# Patient Record
Sex: Male | Born: 1955 | Marital: Married | Smoking: Never smoker
Health system: Southern US, Community
[De-identification: ages and names within clinical notes are randomized; demographics above are authoritative.]

## PROBLEM LIST (undated history)

## (undated) DIAGNOSIS — E785 Hyperlipidemia, unspecified: Secondary | ICD-10-CM

## (undated) DIAGNOSIS — K219 Gastro-esophageal reflux disease without esophagitis: Secondary | ICD-10-CM

## (undated) DIAGNOSIS — B0052 Herpesviral keratitis: Secondary | ICD-10-CM

## (undated) DIAGNOSIS — N419 Inflammatory disease of prostate, unspecified: Secondary | ICD-10-CM

## (undated) HISTORY — DX: Hyperlipidemia, unspecified: E78.5

## (undated) HISTORY — DX: Herpesviral keratitis: B00.52

## (undated) HISTORY — DX: Inflammatory disease of prostate, unspecified: N41.9

## (undated) HISTORY — PX: TONSILLECTOMY: SUR1361

## (undated) HISTORY — DX: Gastro-esophageal reflux disease without esophagitis: K21.9

---

## 2004-06-17 LAB — HM COLONOSCOPY: HM Colonoscopy: NEGATIVE

## 2011-02-21 ENCOUNTER — Encounter: Payer: Self-pay | Admitting: Internal Medicine

## 2011-02-21 ENCOUNTER — Ambulatory Visit (INDEPENDENT_AMBULATORY_CARE_PROVIDER_SITE_OTHER): Payer: 59 | Admitting: Internal Medicine

## 2011-02-21 DIAGNOSIS — B0052 Herpesviral keratitis: Secondary | ICD-10-CM | POA: Insufficient documentation

## 2011-02-21 DIAGNOSIS — R21 Rash and other nonspecific skin eruption: Secondary | ICD-10-CM

## 2011-02-21 DIAGNOSIS — Z125 Encounter for screening for malignant neoplasm of prostate: Secondary | ICD-10-CM

## 2011-02-21 DIAGNOSIS — E785 Hyperlipidemia, unspecified: Secondary | ICD-10-CM | POA: Insufficient documentation

## 2011-02-21 DIAGNOSIS — Z136 Encounter for screening for cardiovascular disorders: Secondary | ICD-10-CM

## 2011-02-21 DIAGNOSIS — K219 Gastro-esophageal reflux disease without esophagitis: Secondary | ICD-10-CM

## 2011-02-21 DIAGNOSIS — Z0000 Encounter for general adult medical examination without abnormal findings: Secondary | ICD-10-CM

## 2011-02-21 DIAGNOSIS — R8290 Unspecified abnormal findings in urine: Secondary | ICD-10-CM

## 2011-02-21 LAB — CBC WITH DIFFERENTIAL/PLATELET
Basophils Absolute: 0 K/uL (ref 0.0–0.1)
Basophils Relative: 0.4 % (ref 0.0–3.0)
Eosinophils Absolute: 0.1 K/uL (ref 0.0–0.7)
Eosinophils Relative: 1.5 % (ref 0.0–5.0)
HCT: 42.6 % (ref 39.0–52.0)
Hemoglobin: 14.5 g/dL (ref 13.0–17.0)
Lymphocytes Relative: 27.6 % (ref 12.0–46.0)
Lymphs Abs: 1 K/uL (ref 0.7–4.0)
MCHC: 34 g/dL (ref 30.0–36.0)
MCV: 91.8 fl (ref 78.0–100.0)
Monocytes Absolute: 0.3 K/uL (ref 0.1–1.0)
Monocytes Relative: 8.1 % (ref 3.0–12.0)
Neutro Abs: 2.2 K/uL (ref 1.4–7.7)
Neutrophils Relative %: 62.4 % (ref 43.0–77.0)
Platelets: 170 K/uL (ref 150.0–400.0)
RBC: 4.64 Mil/uL (ref 4.22–5.81)
RDW: 12.9 % (ref 11.5–14.6)
WBC: 3.6 K/uL — ABNORMAL LOW (ref 4.5–10.5)

## 2011-02-21 LAB — COMPREHENSIVE METABOLIC PANEL WITH GFR
ALT: 20 U/L (ref 0–53)
AST: 23 U/L (ref 0–37)
Albumin: 4.2 g/dL (ref 3.5–5.2)
Alkaline Phosphatase: 38 U/L — ABNORMAL LOW (ref 39–117)
BUN: 18 mg/dL (ref 6–23)
CO2: 30 meq/L (ref 19–32)
Calcium: 9 mg/dL (ref 8.4–10.5)
Chloride: 107 meq/L (ref 96–112)
Creatinine, Ser: 0.9 mg/dL (ref 0.4–1.5)
GFR: 90.05 mL/min
Glucose, Bld: 106 mg/dL — ABNORMAL HIGH (ref 70–99)
Potassium: 4.5 meq/L (ref 3.5–5.1)
Sodium: 142 meq/L (ref 135–145)
Total Bilirubin: 0.7 mg/dL (ref 0.3–1.2)
Total Protein: 6.8 g/dL (ref 6.0–8.3)

## 2011-02-21 LAB — POCT URINALYSIS DIPSTICK
Bilirubin, UA: NEGATIVE
Clarity, UA: NORMAL
Glucose, UA: NEGATIVE
Ketones, UA: NEGATIVE
Leukocytes, UA: NEGATIVE

## 2011-02-21 LAB — LIPID PANEL
Cholesterol: 207 mg/dL — ABNORMAL HIGH (ref 0–200)
HDL: 53.4 mg/dL (ref >39.00)
Triglycerides: 157 mg/dL — ABNORMAL HIGH (ref 0.0–149.0)

## 2011-02-21 LAB — LDL CHOLESTEROL, DIRECT: Direct LDL: 127.9 mg/dL

## 2011-02-21 MED ORDER — PANTOPRAZOLE SODIUM 40 MG PO TBEC: 40.0000 mg | DELAYED_RELEASE_TABLET | Freq: Every day | ORAL | Status: DC

## 2011-02-21 MED ORDER — HYDROCORTISONE VALERATE 0.2 % EX CREA: TOPICAL_CREAM | CUTANEOUS | Status: DC | PRN

## 2011-02-21 NOTE — Progress Notes (Signed)
  Subjective:    Patient ID: Jonathan Morgan, male    DOB: 1955-09-23, 55 y.o.   MRN: [id number]  HPI CPX, in addition likes to discuss the following: --pantoprazole long term use causes bone issues ? Has self decreased dose to qod w/o increase in sx ---Likes a HIV test and STDs screening  --Rash:  RF  Past Medical History  Diagnosis Date  . GERD (gastroesophageal reflux disease)     EGD 2006 normal  . Mild hyperlipidemia   . 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 History   Social History  . Marital Status: Single    Spouse Name: N/A    Number of Children: 2  . Years of Education: N/A   Occupational History  . senior Neurosurgeon     Social History Main Topics  . Smoking status: Never Smoker   . Smokeless tobacco: Not on file  . Alcohol Use: Yes     socially   . Drug Use: No  . Sexually Active: Not on file   Other Topics Concern  . Not on file   Social History Narrative   Sexual orientation gait, has a partner on a monogamous relationshipDiet: healthy--exercise : does routinely    Family History  Problem Relation Age of Onset  . Coronary artery disease Neg Hx   . Diabetes      GM  . Hypertension      M , younger brother  . Cancer      Bone Ca, no prostate or colon      Review of Systems  Constitutional: Negative for fever and fatigue.  Respiratory: Negative for cough and shortness of breath.   Cardiovascular: Negative for chest pain and leg swelling.  Gastrointestinal: Negative for abdominal pain and blood in stool.  Genitourinary: Negative for dysuria, hematuria and difficulty urinating.  Psychiatric/Behavioral:       Occ hard time falling asleep, some stress but no anxiety-depression       Objective:   Physical Exam  Constitutional: He is oriented to person, place, and time. He appears well-developed and well-nourished.  HENT:  Head: Normocephalic and atraumatic.    Neck: No thyromegaly present.       Right salivary gland slightly larger than the left (a chronic finding according to the patient), no  tenderness  Cardiovascular: Normal rate, regular rhythm and normal heart sounds.   No murmur heard. Pulmonary/Chest: Effort normal and breath sounds normal. No respiratory distress. He has no wheezes. He has no rales.  Abdominal: Soft. Bowel sounds are normal. He exhibits no distension. There is no tenderness. There is no rebound and no guarding.  Genitourinary: Rectum normal and prostate normal.  Musculoskeletal: He exhibits no edema.  Neurological: He is alert and oriented to person, place, and time.  Skin: Skin is warm and dry.  Psychiatric: He has a normal mood and affect. His behavior is normal. Thought content normal.          Assessment & Plan:  Besides the CPX, we spent additional time spent discussing GERD

## 2011-02-21 NOTE — Assessment & Plan Note (Addendum)
Recent concern about long-term effects of PPIs. Normal EGD in 2006. UTD reviewed: "After a critical review of all available safety data, a 2008 guideline on gastroesophageal reflux disease (GERD) management developed by the American Gastro[employer] Institute found insufficient evidence to recommend for or against bone density studies, calcium supplementation, H. pylori screening, or any other routine precaution in patients taking proton pump inhibitors [50]. However, studies subsequent to that guideline continue to raise concerns about possible infectious complications, electrolyte disturbances, and metabolic bone disease associated with PPI use." Nevertheless I recommend to use the lowest effective therapy plus life style precautions (info provided) See instructions

## 2011-02-21 NOTE — Assessment & Plan Note (Signed)
occ rash in the "tail bone area" , told before is ?psoriasis, needs steroids prn: RF done

## 2011-02-21 NOTE — Patient Instructions (Addendum)
Tried to step down to the lowest effective therapy. Options are, Ranitidine 75 mg twice a day (OTC) Prilosec 20 mg once a day (OTC) Protonix lower  dose Also lifestyle modification.

## 2011-02-21 NOTE — Assessment & Plan Note (Addendum)
Tetanus shot in 2000, give one today. Patient reports a normal colonoscopy in 2006, Next 2016. Continue with healthy lifestyle. Labs Concerned about STDs, will do RPR, HIV, G&C.  Urine dip abnormal, he is asymptomatic, will do a urine culture and treat if appropriate. Safe sex discussed

## 2011-02-22 LAB — HEPATITIS B SURFACE ANTIBODY,QUALITATIVE: Hep B S Ab: NEGATIVE

## 2011-02-22 LAB — GC/CHLAMYDIA PROBE AMP, URINE
Chlamydia, Swab/Urine, PCR: NEGATIVE
GC Probe Amp, Urine: NEGATIVE

## 2011-02-22 LAB — HIV ANTIBODY (ROUTINE TESTING W REFLEX): HIV: NONREACTIVE

## 2011-02-22 LAB — HEPATITIS B SURFACE ANTIGEN: Hepatitis B Surface Ag: NEGATIVE

## 2011-02-23 LAB — CULTURE, URINE COMPREHENSIVE: Colony Count: NO GROWTH

## 2011-02-25 ENCOUNTER — Telehealth: Payer: Self-pay | Admitting: Internal Medicine

## 2011-02-25 NOTE — Telephone Encounter (Signed)
Error---I meant Dr Drue Novel, not Dr Alwyn Ren

## 2011-02-26 ENCOUNTER — Telehealth: Payer: Self-pay | Admitting: *Deleted

## 2011-02-26 NOTE — Telephone Encounter (Signed)
Informed patient of lab results

## 2011-04-15 ENCOUNTER — Telehealth: Payer: Self-pay

## 2011-04-15 DIAGNOSIS — Z9189 Other specified personal risk factors, not elsewhere classified: Secondary | ICD-10-CM

## 2011-04-15 NOTE — Telephone Encounter (Signed)
Pt walked in to c/o receiving a bill for lab work. Pt was told by insurance company that if his MD would change the dx code for the hepatitis b and c labs to "at risk for", then the insurance company will pay for it. He states that he requested that this be done a month ago but he has received another bill

## 2011-04-16 NOTE — Telephone Encounter (Signed)
I'll ask Jonathan Morgan to change codes .

## 2011-04-17 NOTE — Telephone Encounter (Signed)
Pt would like to know if MD will call the lab to make the change. He states that, "logically, it makes more sense for the doctor to call to change the code because if not, then anyone could call and change it." please advise

## 2011-04-17 NOTE — Telephone Encounter (Addendum)
Per Mr Jonathan Morgan:  Hep tests were done at [facility] not at [facility], so [facility] will have to correct the dx that was used to bill test. Patient should notify [facility].   Please notify patient

## 2011-04-17 NOTE — Telephone Encounter (Signed)
Left message to notify pt  

## 2011-04-18 DIAGNOSIS — Z9189 Other specified personal risk factors, not elsewhere classified: Secondary | ICD-10-CM | POA: Insufficient documentation

## 2011-04-18 NOTE — Telephone Encounter (Signed)
I added a diagnoses "at risk for hepatitis". Send the code to  wherever it needs to be sent. Further questions? send to our manager.

## 2011-04-24 ENCOUNTER — Telehealth: Payer: Self-pay

## 2011-04-24 NOTE — Telephone Encounter (Signed)
Spoke with Jonathan Morgan at [place] to update the dx code for pt's hepatitis lab work done on 02/21/11. Dx code was changed to "at risk for hepatitis" V49.89 and re-submitted for billing.   Pt aware of above note

## 2011-05-02 ENCOUNTER — Ambulatory Visit (INDEPENDENT_AMBULATORY_CARE_PROVIDER_SITE_OTHER): Payer: 59 | Admitting: Internal Medicine

## 2011-05-02 ENCOUNTER — Telehealth: Payer: Self-pay | Admitting: Internal Medicine

## 2011-05-02 ENCOUNTER — Encounter: Payer: Self-pay | Admitting: Internal Medicine

## 2011-05-02 ENCOUNTER — Ambulatory Visit (HOSPITAL_BASED_OUTPATIENT_CLINIC_OR_DEPARTMENT_OTHER)
Admission: RE | Admit: 2011-05-02 | Discharge: 2011-05-02 | Disposition: A | Discharge status: Home / Self Care | Payer: 59 | Source: Ambulatory Visit | Attending: Internal Medicine | Admitting: Internal Medicine

## 2011-05-02 VITALS — BP 116/80 | HR 65 | Temp 98.9°F | Wt 183.6 lb

## 2011-05-02 DIAGNOSIS — R51 Headache: Secondary | ICD-10-CM

## 2011-05-02 DIAGNOSIS — R519 Headache, unspecified: Secondary | ICD-10-CM | POA: Insufficient documentation

## 2011-05-02 MED ORDER — PROMETHAZINE HCL 12.5 MG PO TABS: 12.5000 mg | ORAL_TABLET | Freq: Four times a day (QID) | ORAL | Status: DC | PRN

## 2011-05-02 MED ORDER — HYDROCODONE-ACETAMINOPHEN 7.5-750 MG PO TABS: 1.0000 | ORAL_TABLET | Freq: Four times a day (QID) | ORAL | Status: DC | PRN

## 2011-05-02 NOTE — Progress Notes (Signed)
  Subjective:    Patient ID: Jonathan Morgan, male    DOB: August 03, 1955, 55 y.o.   MRN: [id number]  HPI 8 days history of moderate to severe headache, almost exclusively located on the left side of the head and face, associated with nausea on and off, over-the-counter "cold and sinus medicine"  help to some extent and only temporarily.  Past Medical History  Diagnosis Date  . GERD (gastroesophageal reflux disease)     EGD 2006 normal  . Mild hyperlipidemia   . 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 Marital Status: Single    Spouse Name: N/A    Number of Children: 2  . Years of Education: N/A   Occupational History  . senior Neurosurgeon     Social History Main Topics  . Smoking status: Never Smoker   . Smokeless tobacco: Not on file  . Alcohol Use: Yes     socially   . Drug Use: No  . Sexually Active: Not on file   Other Topics Concern  . Not on file   Social History Narrative   Sexual orientation gait, has a partner on a monogamous relationshipDiet: healthy--exercise : does routinely     Review of Systems No fever or chills He has a history of left eye keratitis but denies any eyeball pain, redness or discharge. No eye tearing. No jaw claudication No vomiting or diarrhea No runny nose, sore throat, nasal discharge. Denies any head injury or neck pain No rash, no photophobia. No increased amount of emotional distress.      Objective:   Physical Exam  Constitutional: He is oriented to person, place, and time. He appears well-developed and well-nourished. No distress.  HENT:  Head: Normocephalic and atraumatic.  Right Ear: External ear normal.  Left Ear: External ear normal.       TMJ not tender  Eyes: EOM are normal. Pupils are equal, round, and reactive to light.       Eyes without redness or discharge  Neck: Normal range of motion. Neck supple.  Pulmonary/Chest: Effort normal and  breath sounds normal. No respiratory distress. He has no wheezes. He has no rales.  Neurological: He is alert and oriented to person, place, and time.       Speech, gait and motor are intact. Face is symmetric, DTRs normal  Skin: Skin is warm and dry. He is not diaphoretic.  Psychiatric: He has a normal mood and affect. His behavior is normal. Judgment and thought content normal.      Assessment & Plan:

## 2011-05-02 NOTE — Telephone Encounter (Signed)
Advise patient, CT negative, plan is the same

## 2011-05-02 NOTE — Patient Instructions (Signed)
Rest, fluids Take meds as prescribed ER if headache persist ( even if CT ok), rash, fever Call if no better within 48 hours

## 2011-05-02 NOTE — Assessment & Plan Note (Addendum)
Persistent headache for the last 8 days, "very close to the worse of my life". On further questioning, he has been experienced on and off headache for the last one or 2 years, even more persistent so lately . Differential diagnosis is large and includes  ICB, migraine headaches, cluster headache. Doubt infectious etiology. Does not appear to have glaucoma. Plan: CT Neuro referral Medications, see prescriptions ER if symptoms severe

## 2011-05-03 ENCOUNTER — Encounter: Payer: Self-pay | Admitting: Neurology

## 2011-05-03 NOTE — Telephone Encounter (Signed)
I spoke with the patient, he is doing much better today. Plan is the same, will refer to neurology, if migraines are confirmed, then  he can either take an abortive medication or preventive meds depending on the frequency of the headache

## 2011-05-03 NOTE — Telephone Encounter (Signed)
Pt aware and verbalized understanding.  Pt would like to know what the plan is going forward for migraines. Please advise

## 2011-05-03 NOTE — Telephone Encounter (Signed)
LMOM, asked for a call back 

## 2011-05-27 ENCOUNTER — Other Ambulatory Visit: Payer: Self-pay | Admitting: *Deleted

## 2011-06-04 ENCOUNTER — Ambulatory Visit: Payer: 59 | Admitting: Neurology

## 2011-07-03 ENCOUNTER — Other Ambulatory Visit: Payer: Self-pay | Admitting: Internal Medicine

## 2011-07-04 ENCOUNTER — Telehealth: Payer: Self-pay | Admitting: Internal Medicine

## 2011-07-04 DIAGNOSIS — R51 Headache: Secondary | ICD-10-CM

## 2011-07-04 NOTE — Telephone Encounter (Signed)
Refill- hydrocodon-acetaminoph 7.5-750. Take one tablet by mouth every 6 hours as needed for pain.

## 2011-07-04 NOTE — Telephone Encounter (Signed)
Pt last seen 05/02/11. Pls advise.

## 2011-07-04 NOTE — Telephone Encounter (Signed)
Ok to Rf #20 as needed for HAs He needs to see neurology before we continue refilling

## 2011-07-05 MED ORDER — HYDROCODONE-ACETAMINOPHEN 7.5-750 MG PO TABS: 1.0000 | ORAL_TABLET | Freq: Four times a day (QID) | ORAL | Status: DC | PRN

## 2011-07-05 MED ORDER — PROMETHAZINE HCL 12.5 MG PO TABS: 12.5000 mg | ORAL_TABLET | Freq: Four times a day (QID) | ORAL | Status: AC | PRN

## 2011-07-05 NOTE — Telephone Encounter (Signed)
Pt is aware hydrocodone will be called in to pharmacy and pt needs to see neurology.  Pt states he would like phenergan as well due to the hydrocodone making him feel sick to his stomach.

## 2011-07-05 NOTE — Telephone Encounter (Signed)
Ok per Dr. Drue Novel to call in phenegran 12.5 mg 1 po qid #20.

## 2011-07-08 NOTE — Telephone Encounter (Signed)
rx sent to pharmacy by e-script  

## 2011-07-10 ENCOUNTER — Other Ambulatory Visit: Payer: Self-pay | Admitting: Internal Medicine

## 2011-07-10 NOTE — Telephone Encounter (Signed)
Denied. Needs to see neurology as recommended ; if headache is severe, needs office visit here or go to the ER

## 2011-07-10 NOTE — Telephone Encounter (Signed)
Hydrocodone-APAP request [last refill 01.18.13 #[id number]]

## 2011-07-11 ENCOUNTER — Telehealth: Payer: Self-pay | Admitting: Internal Medicine

## 2011-07-11 NOTE — Telephone Encounter (Signed)
Patient states that he talked to pharmacy about vicoden prescription from 07-04-10. Patient states that pharmacy never received refill.

## 2011-07-11 NOTE — Telephone Encounter (Signed)
Spoke with pt and pharmacy and Rx for vicodin was not received at pharmacy on 07/05/11.   Verbal Rx for vicodin that was ok'ed by [facility] on 07/05/11 phoned into pharmacist.

## 2011-07-11 NOTE — Telephone Encounter (Signed)
Spoke with pt and pharmacy and Rx for vicodin was not received at pharmacy on 07/05/11.   Verbal Rx for vicodin that was ok'ed by [facility] on 07/05/11 phoned into pharmacist.   

## 2011-09-27 ENCOUNTER — Telehealth: Payer: Self-pay | Admitting: *Deleted

## 2011-09-27 MED ORDER — PANTOPRAZOLE SODIUM 40 MG PO TBEC: 40.0000 mg | DELAYED_RELEASE_TABLET | Freq: Every day | ORAL | Status: DC

## 2011-09-27 NOTE — Telephone Encounter (Signed)
Refill done.  

## 2011-09-30 ENCOUNTER — Other Ambulatory Visit: Payer: Self-pay | Admitting: *Deleted

## 2011-09-30 MED ORDER — PANTOPRAZOLE SODIUM 40 MG PO TBEC: 40.0000 mg | DELAYED_RELEASE_TABLET | Freq: Every day | ORAL | Status: DC

## 2012-03-24 ENCOUNTER — Telehealth: Payer: Self-pay | Admitting: Internal Medicine

## 2012-03-24 MED ORDER — PANTOPRAZOLE SODIUM 40 MG PO TBEC: 40.0000 mg | DELAYED_RELEASE_TABLET | Freq: Every day | ORAL | Status: DC

## 2012-03-24 NOTE — Telephone Encounter (Signed)
Spoke with pt to let him know i was sending in #90 to his pharmacy & when he comes in for his physical in November we will send in a year supply.

## 2012-03-24 NOTE — Telephone Encounter (Signed)
Patient states his pharmacy ([facility]) will be faxing over refill request for pantoprazole. Pt requests 1 year supply and does not understand why we did not do this already. Pt can be reached at [phone] with any questions.

## 2012-05-11 ENCOUNTER — Ambulatory Visit (INDEPENDENT_AMBULATORY_CARE_PROVIDER_SITE_OTHER): Payer: 59 | Admitting: Internal Medicine

## 2012-05-11 ENCOUNTER — Encounter: Payer: Self-pay | Admitting: Internal Medicine

## 2012-05-11 VITALS — BP 132/78 | HR 60 | Temp 98.5°F | Ht 71.5 in | Wt 187.0 lb

## 2012-05-11 DIAGNOSIS — R51 Headache: Secondary | ICD-10-CM

## 2012-05-11 DIAGNOSIS — J9801 Acute bronchospasm: Secondary | ICD-10-CM

## 2012-05-11 DIAGNOSIS — K219 Gastro-esophageal reflux disease without esophagitis: Secondary | ICD-10-CM

## 2012-05-11 DIAGNOSIS — Z0000 Encounter for general adult medical examination without abnormal findings: Secondary | ICD-10-CM

## 2012-05-11 LAB — COMPREHENSIVE METABOLIC PANEL
ALT: 22 U/L (ref 0–53)
Albumin: 4.3 g/dL (ref 3.5–5.2)
CO2: 31 mEq/L (ref 19–32)
Calcium: 9.3 mg/dL (ref 8.4–10.5)
Chloride: 102 mEq/L (ref 96–112)
GFR: 113.5 mL/min (ref >60.00)
Potassium: 3.9 mEq/L (ref 3.5–5.1)
Sodium: 138 mEq/L (ref 135–145)
Total Protein: 7 g/dL (ref 6.0–8.3)

## 2012-05-11 LAB — LIPID PANEL
Cholesterol: 219 mg/dL — ABNORMAL HIGH (ref 0–200)
HDL: 56.2 mg/dL (ref >39.00)
VLDL: 36.2 mg/dL (ref 0.0–40.0)

## 2012-05-11 LAB — CBC WITH DIFFERENTIAL/PLATELET
Basophils Absolute: 0 10*3/uL (ref 0.0–0.1)
Eosinophils Absolute: 0.1 10*3/uL (ref 0.0–0.7)
Lymphocytes Relative: 35.7 % (ref 12.0–46.0)
MCHC: 32.9 g/dL (ref 30.0–36.0)
Monocytes Relative: 8 % (ref 3.0–12.0)
Platelets: 174 10*3/uL (ref 150.0–400.0)
RDW: 13.1 % (ref 11.5–14.6)

## 2012-05-11 LAB — LDL CHOLESTEROL, DIRECT: Direct LDL: 145.2 mg/dL

## 2012-05-11 MED ORDER — ALBUTEROL SULFATE HFA 108 (90 BASE) MCG/ACT IN AERS: 2.0000 | INHALATION_SPRAY | Freq: Four times a day (QID) | RESPIRATORY_TRACT | Status: AC | PRN

## 2012-05-11 NOTE — Assessment & Plan Note (Signed)
occ headaches, thinks related to sinus congestion, no further severe or prolonged HAs. Unable to see neuro ($). Uses allergy meds OTC and usually responds well

## 2012-05-11 NOTE — Progress Notes (Signed)
  Subjective:    Patient ID: Jonathan Morgan, male    DOB: [birth year], 56 y.o.   MRN: [id number]  HPI Complete physical exam  Past Medical History  Diagnosis Date  . GERD (gastroesophageal reflux disease)     EGD 2006 normal  . Mild hyperlipidemia   . 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 No past surgeries    History   Social History  . Marital Status: Single    Spouse Name: N/A    Number of Children: 2  . Years of Education: N/A   Occupational History  . senior Systems analyst   Social History Main Topics  . Smoking status: Never Smoker   . Smokeless tobacco: Never Used  . Alcohol Use: Yes     Comment: socially   . Drug Use: No  . Sexually Active: Not on file   Other Topics Concern  . Not on file   Social History Narrative   Sexual orientation gay, has a partner on a monogamous relationship----Diet: healthy --exercise : does routinely    Family History  Problem Relation Age of Onset  . Coronary artery disease Neg Hx   . Diabetes      GM  . Hypertension      M , younger brother  . Cancer      Bone Ca, no prostate or colon     Review of Systems Denies chest pain or shortness of breath No nausea, vomiting, diarrhea or blood in the stools No dysuria, difficulty urinating. Occasionally has wheezing, see assessment and plan. GERD well-controlled, needs a RF     Objective:   Physical Exam  General -- alert, well-developed, and well-nourished.   Neck --no thyromegaly , normal carotid pulse Lungs -- normal respiratory effort, no intercostal retractions, no accessory muscle use, and normal breath sounds.   Heart-- normal rate, regular rhythm, no murmur, and no gallop.   Abdomen--soft, non-tender, no distention, no masses, no HSM, no guarding, and no rigidity.   Extremities-- no pretibial edema bilaterally Rectal-- No external abnormalities  noted. Normal sphincter tone. No rectal masses or tenderness. Brown stool  Prostate:  Prostate gland firm and smooth, no enlargement, nodularity, tenderness, mass, asymmetry or induration. Neurologic-- alert & oriented X3 and strength normal in all extremities. Psych-- Cognition and judgment appear intact. Alert and cooperative with normal attention span and concentration.  not anxious appearing and not depressed appearing.      Assessment & Plan:

## 2012-05-11 NOTE — Assessment & Plan Note (Signed)
Sx well controlled, RF x 1 year

## 2012-05-11 NOTE — Assessment & Plan Note (Signed)
Although he does not have the diagnosis of asthma, couple of times a year has some wheezing that responds to albuterol, request a refill. Will do.

## 2012-05-11 NOTE — Assessment & Plan Note (Addendum)
Tetanus shot in 2000, and 2012  Got a flu shot  Patient reports a normal colonoscopy in 2006, Next 2016. Continue with healthy lifestyle. Labs Denies need for STD checking,not sex active lately. heb B neg, offered shots, will think about it

## 2012-06-17 DIAGNOSIS — N419 Inflammatory disease of prostate, unspecified: Secondary | ICD-10-CM

## 2012-06-17 HISTORY — DX: Inflammatory disease of prostate, unspecified: N41.9

## 2012-08-01 ENCOUNTER — Other Ambulatory Visit: Payer: Self-pay

## 2012-08-21 ENCOUNTER — Institutional Professional Consult (permissible substitution): Payer: 59 | Admitting: Internal Medicine

## 2012-08-24 ENCOUNTER — Ambulatory Visit (INDEPENDENT_AMBULATORY_CARE_PROVIDER_SITE_OTHER): Payer: 59 | Admitting: Internal Medicine

## 2012-08-24 ENCOUNTER — Encounter: Payer: Self-pay | Admitting: Internal Medicine

## 2012-08-24 VITALS — BP 124/68 | HR 75 | Temp 98.6°F | Wt 180.0 lb

## 2012-08-24 DIAGNOSIS — R002 Palpitations: Secondary | ICD-10-CM

## 2012-08-24 DIAGNOSIS — N410 Acute prostatitis: Secondary | ICD-10-CM

## 2012-08-24 MED ORDER — SULFAMETHOXAZOLE-TRIMETHOPRIM 800-160 MG PO TABS: 1.0000 | ORAL_TABLET | Freq: Two times a day (BID) | ORAL | Status: DC

## 2012-08-24 NOTE — Progress Notes (Signed)
  Subjective:    Patient ID: Jonathan Morgan, male    DOB: 13-Nov-1955, 57 y.o.   MRN: [id number]  HPI Acute visit --2 weeks ago developed mild urinary urgency, 2 episodes of mild dysuria, slightly weaker  urinary stream; symptoms similar to prostatitis dx few years ago but much less intense. Has not taken any meds for his sx. --Also complains of palpitations, on-off, mild, thinks related to anxiety. Denies association with chest pain, near fainting, shortness of breath.  Past Medical History  Diagnosis Date  . GERD (gastroesophageal reflux disease)     EGD 2006 normal  . Mild hyperlipidemia   . 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 No past surgeries      Review of Systems In a stable relationship, actually not sexually intercourse in the last year. Did have a eyaculation a week ago --> increased tremendously his symptoms for few hours. No fever or chills No nausea, vomiting, gross hematuria. No penile discharge. Has started a new diet, working well for him    Objective:   Physical Exam  General -- alert, well-developed     Abdomen--soft, non-tender, no distention, no masses, no HSM, no guarding, and no rigidity.   No CVA tenderness Neurologic-- alert & oriented X3 and strength normal in all extremities. Psych-- Cognition and judgment appear intact. Alert and cooperative with normal attention span and concentration.  not anxious appearing and not depressed appearing.      Assessment & Plan:  Palpitations, Associated with anxiety, no red flag symptoms, recommend observation.

## 2012-08-24 NOTE — Assessment & Plan Note (Signed)
Sx c/w prostatitis, very low risk for STDs Plan: UCX Bactrim dx x 2 weeks Call if not improving

## 2012-08-24 NOTE — Patient Instructions (Addendum)
Drink plenty of fluids, no excessive sun exposure while on antibiotics, call if no better in few days, call if symptoms severe

## 2012-08-26 LAB — CULTURE, URINE COMPREHENSIVE: Organism ID, Bacteria: NO GROWTH

## 2012-08-28 ENCOUNTER — Ambulatory Visit (INDEPENDENT_AMBULATORY_CARE_PROVIDER_SITE_OTHER): Payer: 59 | Admitting: Internal Medicine

## 2012-08-28 ENCOUNTER — Telehealth: Payer: Self-pay | Admitting: Internal Medicine

## 2012-08-28 VITALS — BP 118/76 | HR 67 | Temp 98.3°F

## 2012-08-28 DIAGNOSIS — N410 Acute prostatitis: Secondary | ICD-10-CM

## 2012-08-28 MED ORDER — TAMSULOSIN HCL 0.4 MG PO CAPS: 0.4000 mg | ORAL_CAPSULE | Freq: Every day | ORAL | Status: DC

## 2012-08-28 NOTE — Patient Instructions (Addendum)
Drink plenty of fluids, continue with Bactrim. Start Flomax, take it for at least 2 weeks, then discontinue if you feel better. Call if you're not completely well in 2 weeks.

## 2012-08-28 NOTE — Telephone Encounter (Signed)
Pt states that symptoms have returns along with some abdominal pressure that started on yesterday. Pt notes that once he started the med symptoms did improve for a few days but have now returned. Pt would like to know if he maybe needs a stronger med.Please advise

## 2012-08-28 NOTE — Assessment & Plan Note (Signed)
Recently diagnosed with prostatitis, he improved initially and now he still has some symptoms. He let me do a rectal exam today, his prostate is only slightly enlarged and I think more tender than normal. I still think he has prostatitis, the differential diagnosis includes a small bladder stone. Plan: Check a PSA, continue with bactrim, add Flomax. If sx increase or not completely well, will need further eval (CT? Renal u/s?)

## 2012-08-28 NOTE — Telephone Encounter (Signed)
Left message to call office. Pt has pending OV for 3: 30 today

## 2012-08-28 NOTE — Progress Notes (Signed)
  Subjective:    Patient ID: Jonathan Morgan, male    DOB: 05-Mar-1956, 57 y.o.   MRN: [id number]  HPI Followup visit. Recently diagnosed with prostatitis, taking Bactrim daily. Initially felt better, yesterday he again have urinary urgency and some sensitivity at the end of the urethra. Last night, he had suprapubic pressure which is better today.  Past Medical History  Diagnosis Date  . GERD (gastroesophageal reflux disease)     EGD 2006 normal  . Mild hyperlipidemia   . 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 No past surgeries       Review of Systems No fever chills. No gross hematuria. No nausea- vomiting. No flank pain, did have some muscle skeletal pain in the lower back.     Objective:   Physical Exam General -- alert, well-developed, BMI .   Abdomen--soft, non-tender, no distention, no masses, no HSM, no guarding, and no rigidity.  No CVA tederness  Extremities-- no pretibial edema bilaterally Rectal-- No external abnormalities noted. Normal sphincter tone. No rectal masses or tenderness. Brown stool  Prostate:  Prostate gland firm and smooth, minimal enlargement, although there was no pain per se, he seemed to be more sensitive during the prostate exam than most of the patients. . Psych-- Cognition and judgment appear intact. Alert and cooperative with normal attention span and concentration.  not anxious appearing and not depressed appearing.         Assessment & Plan:

## 2012-08-28 NOTE — Telephone Encounter (Signed)
Patient called regarding his last visit and the antibiotics he is taking. He was told to call if anything changed. Patient would not specify what specific questions he had, but is requesting that Dr. Drue Novel call him back.

## 2012-08-28 NOTE — Telephone Encounter (Signed)
Per Dr Drue Novel Pt to keep pending OV today for further evaluation. Pt aware.

## 2012-08-29 ENCOUNTER — Encounter: Payer: Self-pay | Admitting: Internal Medicine

## 2012-09-13 ENCOUNTER — Telehealth: Payer: Self-pay | Admitting: Internal Medicine

## 2012-09-13 NOTE — Telephone Encounter (Signed)
Please call pt, was dx w/ prostatitis, feeling better? Let me know

## 2012-09-15 NOTE — Telephone Encounter (Signed)
lmovm for pt to return call.  

## 2012-09-15 NOTE — Telephone Encounter (Signed)
Spoke with pt, he states he is doing much better.

## 2012-09-15 NOTE — Telephone Encounter (Signed)
thx

## 2012-10-16 ENCOUNTER — Ambulatory Visit (INDEPENDENT_AMBULATORY_CARE_PROVIDER_SITE_OTHER): Payer: 59 | Admitting: Podiatry

## 2012-10-16 VITALS — BP 133/80 | HR 70 | Ht 71.0 in | Wt 180.0 lb

## 2012-10-16 DIAGNOSIS — M25572 Pain in left ankle and joints of left foot: Secondary | ICD-10-CM

## 2012-10-16 DIAGNOSIS — M216X9 Other acquired deformities of unspecified foot: Secondary | ICD-10-CM

## 2012-10-16 DIAGNOSIS — M25579 Pain in unspecified ankle and joints of unspecified foot: Secondary | ICD-10-CM | POA: Insufficient documentation

## 2012-10-16 DIAGNOSIS — M21969 Unspecified acquired deformity of unspecified lower leg: Secondary | ICD-10-CM

## 2012-10-16 NOTE — Progress Notes (Signed)
SUBJECTIVE: 57 y.o. year old male presents complaining of pain on left foot. Patient points dorsolateral aspect of the 5th Metatarsocuneiform joint area being painful and having shooting type pain at times. Also noted some numbness over the lesser Metatarsophalangeal joint area. Never had any pain on right foot. Last year he had some swelling and pain on lateral ankle area.  Patient is referred by Dr. Drue Novel.  Patient relates history of having foot care while he was living in [place]. He was treated with Orthotics 8 years ago and still wears them.   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 Taking Encylcovir.  Ears, nose, mouth, throat, and face: No problems. Respiratory: During allergy season, uses inhalor.  Cardiovascular: negative Gastrointestinal: negative Genitourinary:Just got over Prostate infection and getting over it now.  Musculoskeletal:Has not had any severe pain till last couple of months on left foot. Neurological: A brief episode of Sciatica that was relieved with Ibuprofen. Allergic/Immunologic: Highly allergic to [facility] Pollen.   OBJECTIVE: DERMATOLOGIC EXAMINATION: Nails and skin are within normal.   VASCULAR EXAMINATION OF LOWER LIMBS: Pedal pulses: All pedal pulses are palpable with normal pulsation.  Capillary Filling times within 3 seconds in all digits.   NEUROLOGIC EXAMINATION OF THE LOWER LIMBS: Achilles DTR is present and within normal. All epicritic and tactile sensations grossly intact.   MUSCULOSKELETAL EXAMINATION: Positive for tight Achilles tendon bilateral. Positive of elevated first ray with hypermobility at the first Tarsometatarsal joint bilateral.   RADIOGRAPHIC STUDIES:  General overview: AP View: Abducted forefoot with increased lateral deviation angle of Calcaneocuboid angle. Increased Talar head medial advancement.   Lateral view:  Positive of elevated first ray with anterior break in CYMA line. All other joints and osseous structures within normal.   ASSESSMENT: 1. Tenosynovitis 5th MCJ secondary to lateral weight shifting. 2. Lesser metatarsalgia. 3. Ankle equinus bilateral. 4. 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   PLAN: Reviewed clinical findings and available treatment options. Patient is to continue with Orthotics. May benefit from Metatarsal binder (Size small 1 pair dispensed). Achilles tendon stretch exercise. Also explained surgical options. Return as needed.

## 2012-10-16 NOTE — Patient Instructions (Addendum)
Seen for pain on left foot. Findings reveal:  1. Tenosynovitis of 5th Metatarsocuneiform joint left due to lateral weight shifting. 2. Lesser Metatarsalgia left foot.  3. Abnormal biomechanics bilateral: Weakened first Tarsometatarsal joint bilateral and Hallux limitus, Tight Achilles tendon bilateral, Excess pronation Subtalar joint bilateral. Recommendation: 1. Custom made Orthotics. 2. Achilles tendon stretch exercise both ankles. 3. Use Metatarsal binder to stabilize first Tarsometatarsal joint bilateral. 4. Continue physical exercise as tolerated.  5. Reviewed surgical options also available. Return as needed.

## 2013-05-12 ENCOUNTER — Telehealth: Payer: Self-pay

## 2013-05-12 NOTE — Telephone Encounter (Signed)
Left message for call back  identifiable     

## 2013-05-12 NOTE — Telephone Encounter (Signed)
Medication List and allergies: reviewed and updated  90 day supply/mail order: [facility] Local prescriptions: [facility] [address]  Immunizations due: UTD  A/P:   No changes to FH or PSH Flu vaccine---03/2013 Tdap--06/2010 CCS--2006--per patient PSA--08/2012---1.96  To Discuss with Provider: Not at this time

## 2013-05-14 ENCOUNTER — Encounter: Payer: Self-pay | Admitting: Internal Medicine

## 2013-05-14 ENCOUNTER — Ambulatory Visit (INDEPENDENT_AMBULATORY_CARE_PROVIDER_SITE_OTHER): Payer: 59 | Admitting: Internal Medicine

## 2013-05-14 VITALS — BP 127/84 | HR 61 | Temp 98.2°F | Ht 71.0 in | Wt 184.0 lb

## 2013-05-14 DIAGNOSIS — Z0000 Encounter for general adult medical examination without abnormal findings: Secondary | ICD-10-CM

## 2013-05-14 DIAGNOSIS — N410 Acute prostatitis: Secondary | ICD-10-CM

## 2013-05-14 DIAGNOSIS — J9801 Acute bronchospasm: Secondary | ICD-10-CM

## 2013-05-14 LAB — COMPREHENSIVE METABOLIC PANEL
ALT: 23 U/L (ref 0–53)
CO2: 28 mEq/L (ref 19–32)
Calcium: 9.2 mg/dL (ref 8.4–10.5)
Chloride: 105 mEq/L (ref 96–112)
GFR: 97.9 mL/min (ref >60.00)
Glucose, Bld: 93 mg/dL (ref 70–99)
Sodium: 138 mEq/L (ref 135–145)
Total Bilirubin: 0.8 mg/dL (ref 0.3–1.2)
Total Protein: 6.6 g/dL (ref 6.0–8.3)

## 2013-05-14 LAB — CBC WITH DIFFERENTIAL/PLATELET
Basophils Absolute: 0 10*3/uL (ref 0.0–0.1)
Basophils Relative: 0.4 % (ref 0.0–3.0)
Eosinophils Absolute: 0.1 10*3/uL (ref 0.0–0.7)
HCT: 43.8 % (ref 39.0–52.0)
Hemoglobin: 14.9 g/dL (ref 13.0–17.0)
Lymphocytes Relative: 31.1 % (ref 12.0–46.0)
Lymphs Abs: 1.3 10*3/uL (ref 0.7–4.0)
MCHC: 34.1 g/dL (ref 30.0–36.0)
MCV: 90.8 fl (ref 78.0–100.0)
Monocytes Absolute: 0.4 10*3/uL (ref 0.1–1.0)
Neutro Abs: 2.4 10*3/uL (ref 1.4–7.7)
RBC: 4.82 Mil/uL (ref 4.22–5.81)
RDW: 12.8 % (ref 11.5–14.6)

## 2013-05-14 LAB — LIPID PANEL
Cholesterol: 212 mg/dL — ABNORMAL HIGH (ref 0–200)
VLDL: 26.2 mg/dL (ref 0.0–40.0)

## 2013-05-14 LAB — PSA: PSA: 1.43 ng/mL (ref 0.10–4.00)

## 2013-05-14 LAB — TSH: TSH: 0.75 u[IU]/mL (ref 0.35–5.50)

## 2013-05-14 NOTE — Assessment & Plan Note (Signed)
Tetanus shot  2012  Got a flu shot  Patient reports a normal colonoscopy in 2006, Next 2016. Continue with healthy lifestyle. Labs

## 2013-05-14 NOTE — Assessment & Plan Note (Signed)
Hardly ever  uses albuterol 

## 2013-05-14 NOTE — Progress Notes (Signed)
Pre visit review using our clinic review tool, if applicable. No additional management support is needed unless otherwise documented below in the visit note. 

## 2013-05-14 NOTE — Progress Notes (Signed)
   Subjective:    Patient ID: Jonathan Morgan, male    DOB: 05/08/1956, 57 y.o.   MRN: [id number]  HPI CPX  Past Medical History  Diagnosis Date  . GERD (gastroesophageal reflux disease)     EGD 2006 normal  . Mild hyperlipidemia   . 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 Prostatitis 2014   Past Surgical History  Procedure Laterality Date  . Tonsillectomy     History   Social History  . Marital Status: Single    Spouse Name: N/A    Number of Children: 2  . Years of Education: N/A   Occupational History  . senior Systems analyst   Social History Main Topics  . Smoking status: Never Smoker   . Smokeless tobacco: Never Used  . Alcohol Use: Yes     Comment: socially   . Drug Use: No  . Sexual Activity: Not on file   Other Topics Concern  . Not on file   Social History Narrative   Sexual orientation gay, has a partner on a monogamous relationship        Family History  Problem Relation Age of Onset  . Coronary artery disease Neg Hx   . Diabetes Other     GM  . Hypertension Mother     Judie Petit , younger brother  . Colon cancer Other     aunt  . Prostate cancer Neg Hx   . ALS Mother     Review of Systems Diet-- healthy Exercise-- regularly No  CP, SOB, lower extremity edema Denies  nausea, vomiting diarrhea Denies  blood in the stools (-) cough, sputum production, (-) wheezing, chest congestion No dysuria, gross hematuria, difficulty urinating   No anxiety, depression     Objective:   Physical Exam BP 127/84  Pulse 61  Temp(Src) 98.2 F (36.8 C)  Ht 5\' 11"  (1.803 m)  Wt 184 lb (83.462 kg)  BMI 25.67 kg/m2  SpO2 97% General -- alert, well-developed, NAD.  Neck --no thyromegaly , normal carotid pulse Lungs -- normal respiratory effort, no intercostal retractions, no accessory muscle use, and normal breath sounds.  Heart-- normal rate, regular rhythm, no murmur.  Abdomen-- Not  distended, good bowel sounds,soft, non-tender. Rectal-- declined Extremities-- no pretibial edema bilaterally  Neurologic--  alert & oriented X3. Speech normal, gait normal, strength normal in all extremities.  Psych-- Cognition and judgment appear intact. Cooperative with normal attention span and concentration. No anxious appearing , no depressed appearing.      Assessment & Plan:

## 2013-05-14 NOTE — Patient Instructions (Signed)
Get your blood work before you leave  Next visit in 1 year for a physical exam  . Fasting Please make an appointment     

## 2013-05-14 NOTE — Assessment & Plan Note (Signed)
resolved 

## 2013-05-27 IMAGING — CT CT HEAD W/O CM
1 series · 16 of 30 positions shown, 20 images · non-contrast
Comparison: None

CLINICAL DATA: Severe left-sided headache.

CT HEAD WITHOUT CONTRAST
TECHNIQUE: Contiguous axial images were obtained from the base of
the skull through the vertex without contrast

[Series 2: head 4.8 h37s · axial · 0.47mm/px · z∈[-141,+19]mm · 16 of 36 slices shown, 20 images]
[im 2/36  brain]
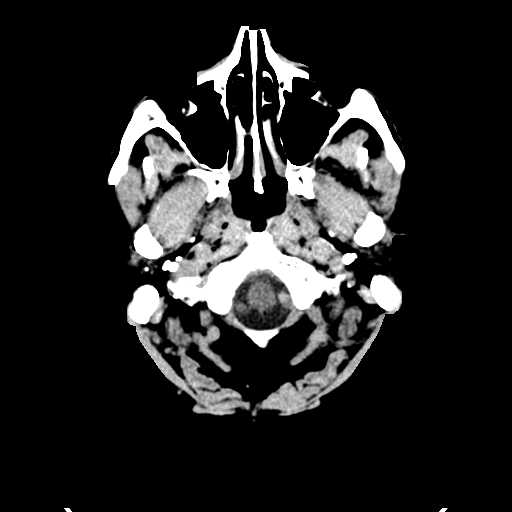
[im 2/36  bone]
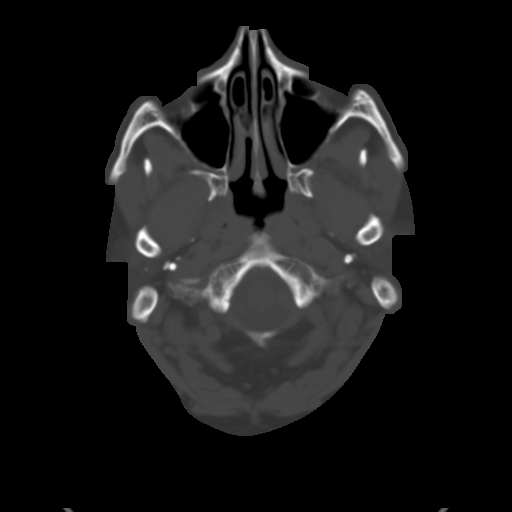
[im 4/36  brain]
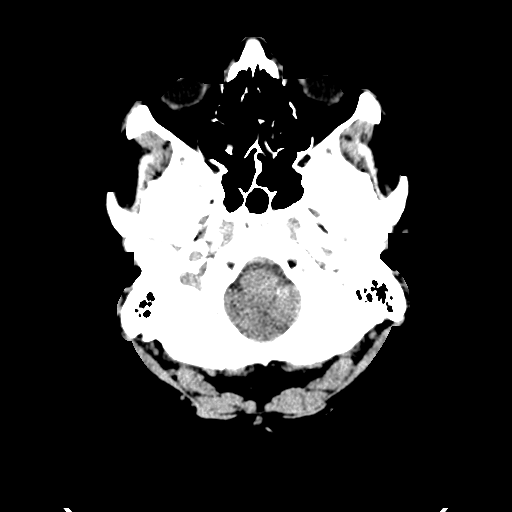
[im 7/36  brain]
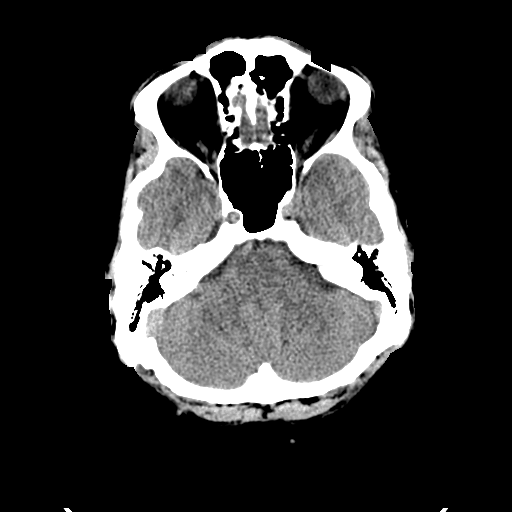
[im 9/36  brain]
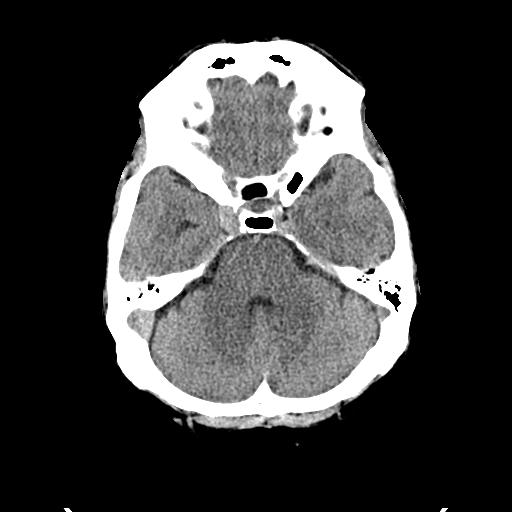
[im 10/36  brain]
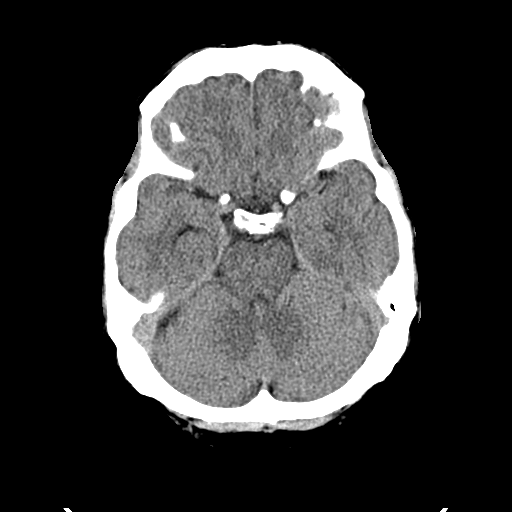
[im 10/36  bone]
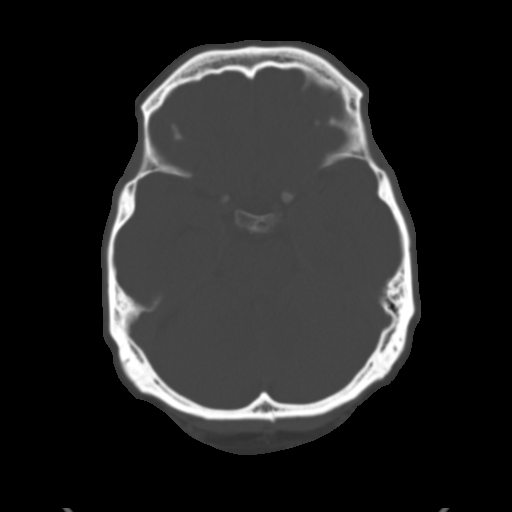
[im 13/36  brain]
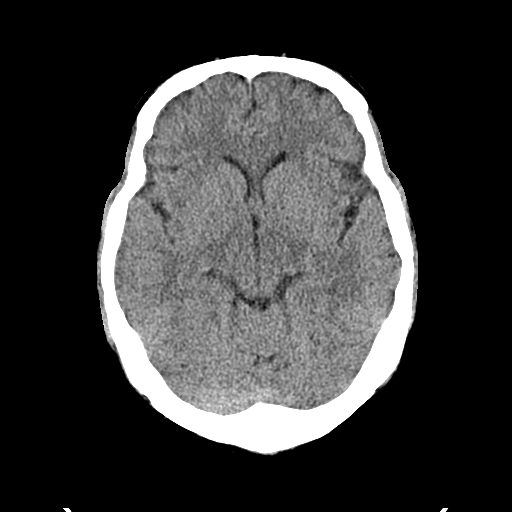
[im 15/36  brain]
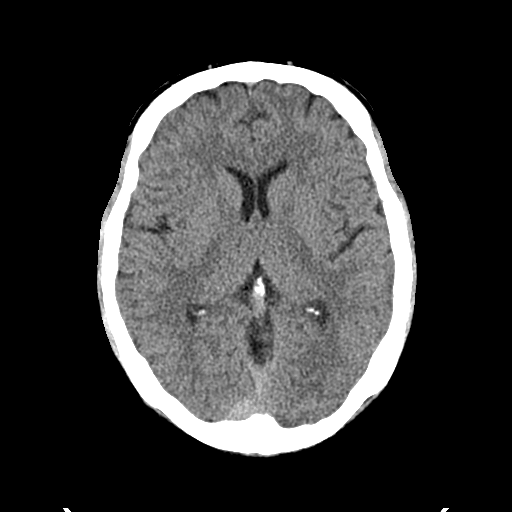
[im 17/36  brain]
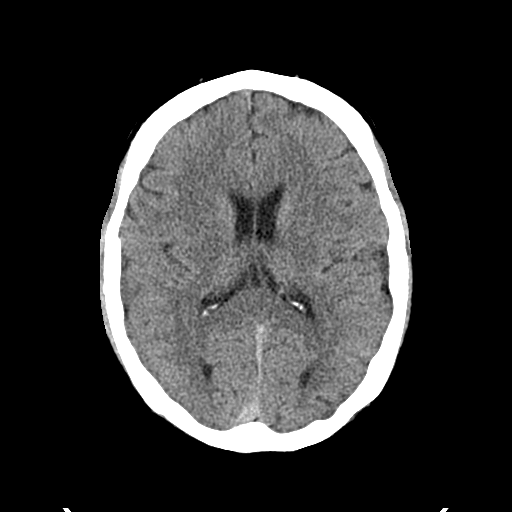
[im 19/36  brain]
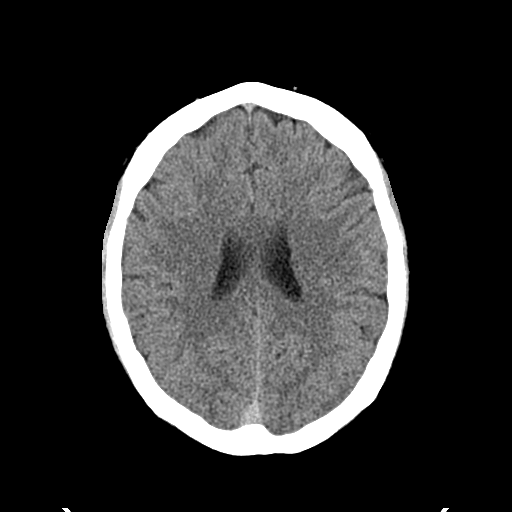
[im 19/36  bone]
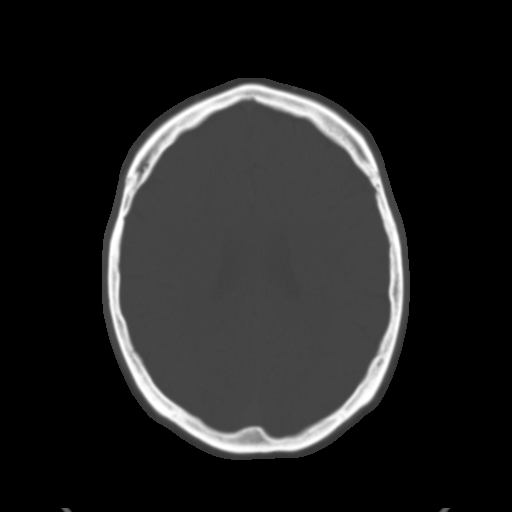
[im 21/36  brain]
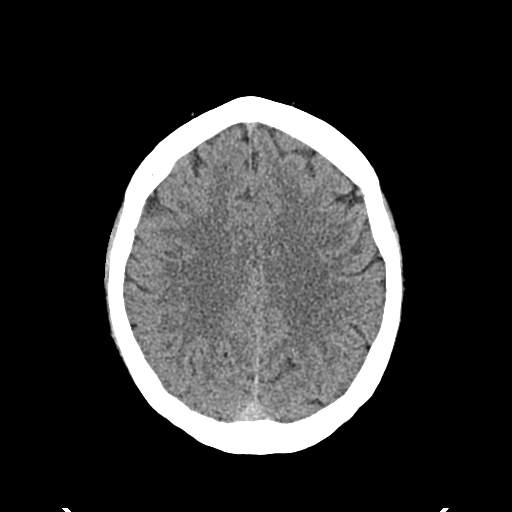
[im 23/36  brain]
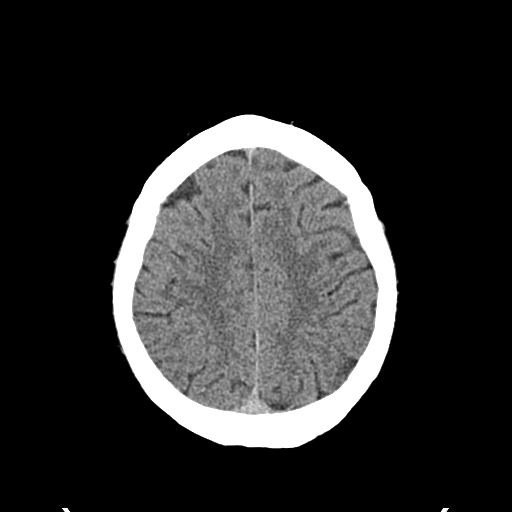
[im 26/36  brain]
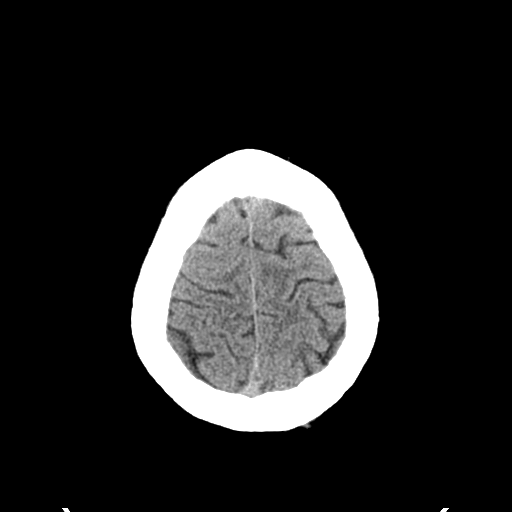
[im 27/36  brain]
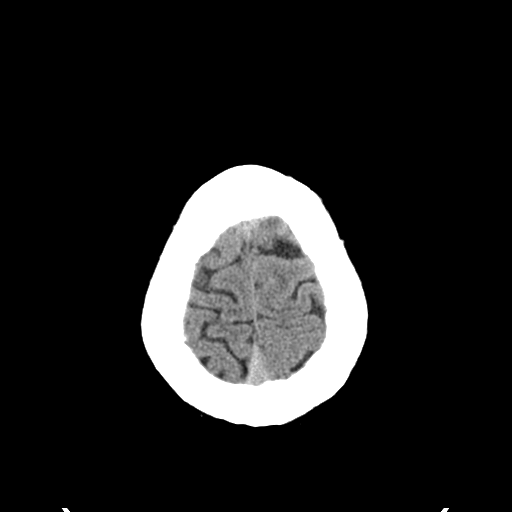
[im 27/36  bone]
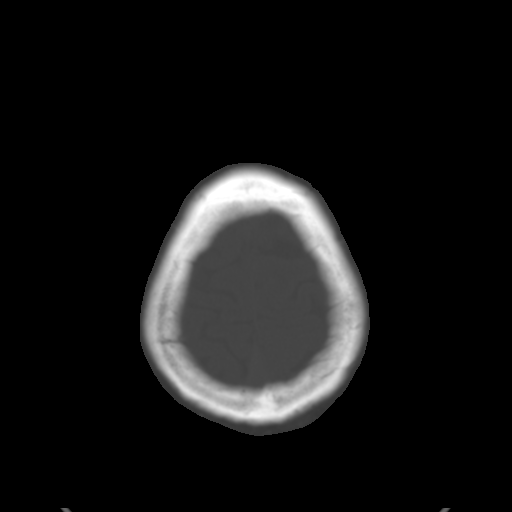
[im 29/36  brain]
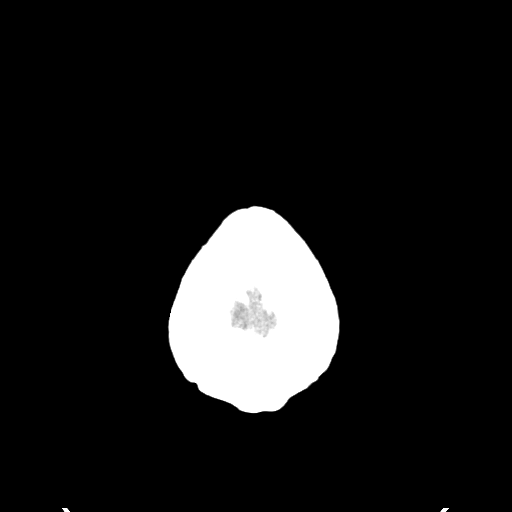
[im 32/36  brain]
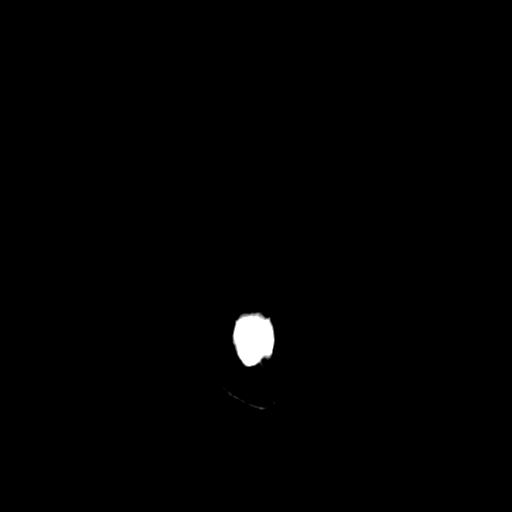
[im 34/36  brain]
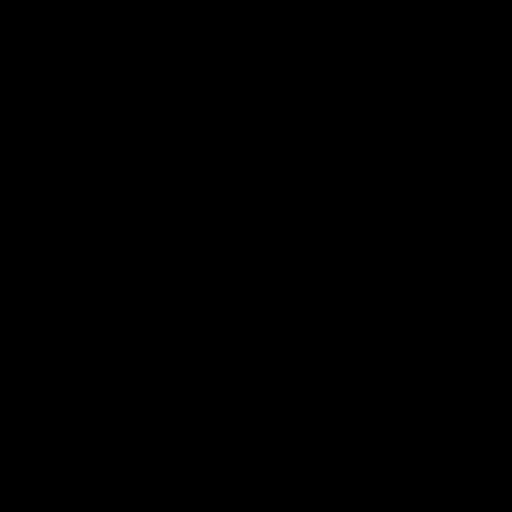

[16 of 30 positions shown; findings below may reference images not displayed]

FINDINGS: There is no evidence of intracranial hemorrhage, brain
edema, or other signs of acute infarction.  There is no evidence of
intracranial mass lesion or mass effect.  No abnormal extraaxial
fluid collections are identified.  There is no evidence of
hydrocephalus, or other significant intracranial abnormality.  No
skull abnormality identified.
IMPRESSION: Negative non-contrast head CT.

## 2014-04-01 ENCOUNTER — Telehealth: Payer: Self-pay | Admitting: Internal Medicine

## 2014-04-01 MED ORDER — PANTOPRAZOLE SODIUM 40 MG PO TBEC: 40.0000 mg | DELAYED_RELEASE_TABLET | Freq: Every day | ORAL | Status: DC

## 2014-04-01 NOTE — Telephone Encounter (Signed)
Pt was last seen on 05/14/2013. Pt has upcoming appt on 12/28. Pt is requesting refill for Protonix, which has not been refilled since 03/2012 # 90 with 0 RF. Okay to refill?   Please advise.

## 2014-04-01 NOTE — Telephone Encounter (Signed)
Okay Protonix #90, 0 refills. Advise patient, if he has severe acid reflux or any problems needs to be seen sooner.

## 2014-04-01 NOTE — Telephone Encounter (Signed)
Protonix sent to Pharmacy.

## 2014-04-01 NOTE — Telephone Encounter (Signed)
Caller name:Jonathan Morgan, Jonathan Morgan Relation to pt: self  Call back number: [phone] [facility] [place] - [id number] [phone]    Reason for call:   Pt requesting a refill pantoprazole (PROTONIX) 40 MG tablet

## 2014-05-09 ENCOUNTER — Other Ambulatory Visit: Payer: Self-pay

## 2014-06-13 ENCOUNTER — Ambulatory Visit (INDEPENDENT_AMBULATORY_CARE_PROVIDER_SITE_OTHER): Payer: 59 | Admitting: Internal Medicine

## 2014-06-13 ENCOUNTER — Encounter: Payer: Self-pay | Admitting: Internal Medicine

## 2014-06-13 VITALS — BP 129/83 | HR 66 | Temp 97.9°F | Ht 71.0 in | Wt 183.5 lb

## 2014-06-13 DIAGNOSIS — Z0000 Encounter for general adult medical examination without abnormal findings: Secondary | ICD-10-CM

## 2014-06-13 NOTE — Assessment & Plan Note (Addendum)
Tetanus shot  2012  Got a flu shot  Avoid zostavax, on daily zovirax  Patient reports a normal colonoscopy in 2006, Next 2016. Continue with healthy lifestyle. Prostate cancer screening, declined a DRE (last was 08-2012) , checking a PSA Labs

## 2014-06-13 NOTE — Progress Notes (Signed)
Pre visit review using our clinic review tool, if applicable. No additional management support is needed unless otherwise documented below in the visit note. 

## 2014-06-13 NOTE — Progress Notes (Signed)
Subjective:    Patient ID: Jonathan MenghiniJan Morgan, male    DOB: 06/19/1955, 58 y.o.   MRN: [id number]  DOS:  06/13/2014 Type of visit - description : cpx Interval history: In general feeling well.  No concerns  ROS Denies chest pain, difficulty breathing or lower extremity edema No nausea, vomiting, diarrhea or blood in the stools GERD symptoms well controlled on PPIs No anxiety or depression No dysuria, gross hematuria difficulty urinating    Past Medical History  Diagnosis Date  . GERD (gastroesophageal reflux disease)     EGD 2006 normal  . Mild hyperlipidemia   . Keratitis, herpetic     [facility], on acyclovir for life  . Prostatitis 2014    Past Surgical History  Procedure Laterality Date  . Tonsillectomy      History   Social History  . Marital Status: Single    Spouse Name: N/A    Number of Children: 2  . Years of Education: N/A   Occupational History  . senior Systems analystadministrative coordinator  [employer]   Social History Main Topics  . Smoking status: Never Smoker   . Smokeless tobacco: Never Used  . Alcohol Use: Yes     Comment: socially   . Drug Use: No  . Sexual Activity: Not on file   Other Topics Concern  . Not on file   Social History Narrative   Sexual orientation gay, has a partner on a monogamous relationship         Family History  Problem Relation Age of Onset  . Coronary artery disease Neg Hx   . Diabetes Other     GM  . Hypertension Mother     Judie PetitM , younger brother  . Colon cancer Other     aunt  . Prostate cancer Neg Hx   . ALS Mother        Medication List       This list is accurate as of: 06/13/14 11:59 PM.  Always use your most recent med list.               acyclovir 400 MG tablet  Commonly known as:  ZOVIRAX  Take 400 mg by mouth 2 (two) times daily.     albuterol 108 (90 BASE) MCG/ACT inhaler  Commonly known as:  VENTOLIN HFA  Inhale 2 puffs into the lungs every 6 (six) hours as  needed for wheezing.     CENTRUM SILVER ULTRA MENS Tabs  Take 1 each by mouth daily.     hydrocortisone valerate cream 0.2 %  Commonly known as:  WESTCORT  Apply topically as needed.     OVER THE COUNTER MEDICATION  as needed. [facility] Cong[facility] Daytime.     pantoprazole 40 MG tablet  Commonly known as:  PROTONIX  Take 1 tablet (40 mg total) by mouth daily.     saw palmetto 160 MG capsule  Take 160 mg by mouth 2 (two) times daily.           Objective:   Physical Exam BP 129/83 mmHg  Pulse 66  Temp(Src) 97.9 F (36.6 C) (Oral)  Ht 5\' 11"  (1.803 m)  Wt 183 lb 8 oz (83.235 kg)  BMI 25.60 kg/m2  SpO2 97% General -- alert, well-developed, NAD.  Neck --no thyromegaly  HEENT-- Not pale.   Lungs -- normal respiratory effort, no intercostal retractions, no accessory muscle use, and normal breath sounds.  Heart-- normal rate, regular  rhythm, no murmur.  Abdomen-- Not distended, good bowel sounds,soft, non-tender. Rectal-- declined  Extremities-- no pretibial edema bilaterally  Neurologic--  alert & oriented X3. Speech normal, gait appropriate for age, strength symmetric and appropriate for age.  Psych-- Cognition and judgment appear intact. Cooperative with normal attention span and concentration. No anxious or depressed appearing.        Assessment & Plan:

## 2014-06-13 NOTE — Patient Instructions (Signed)
Get your blood work before you leave    Please come back to the office in 1 year  for a physical exam. Come back fasting    

## 2014-06-14 LAB — COMPREHENSIVE METABOLIC PANEL
ALT: 22 U/L (ref 0–53)
AST: 21 U/L (ref 0–37)
Albumin: 4.4 g/dL (ref 3.5–5.2)
Alkaline Phosphatase: 39 U/L (ref 39–117)
BUN: 17 mg/dL (ref 6–23)
CO2: 30 mEq/L (ref 19–32)
Calcium: 9.3 mg/dL (ref 8.4–10.5)
Chloride: 106 mEq/L (ref 96–112)
Creatinine, Ser: 0.8 mg/dL (ref 0.4–1.5)
GFR: 106.14 mL/min (ref >60.00)
Glucose, Bld: 86 mg/dL (ref 70–99)
Potassium: 3.9 mEq/L (ref 3.5–5.1)
Sodium: 142 mEq/L (ref 135–145)
Total Bilirubin: 0.8 mg/dL (ref 0.2–1.2)
Total Protein: 6.9 g/dL (ref 6.0–8.3)

## 2014-06-14 LAB — CBC WITH DIFFERENTIAL/PLATELET
Basophils Absolute: 0 10*3/uL (ref 0.0–0.1)
Basophils Relative: 0.7 % (ref 0.0–3.0)
Eosinophils Absolute: 0.1 10*3/uL (ref 0.0–0.7)
Eosinophils Relative: 1.8 % (ref 0.0–5.0)
HCT: 44.6 % (ref 39.0–52.0)
HEMOGLOBIN: 14.8 g/dL (ref 13.0–17.0)
LYMPHS PCT: 31.9 % (ref 12.0–46.0)
Lymphs Abs: 1.8 10*3/uL (ref 0.7–4.0)
MCHC: 33.2 g/dL (ref 30.0–36.0)
MCV: 92.3 fl (ref 78.0–100.0)
MONOS PCT: 6.9 % (ref 3.0–12.0)
Monocytes Absolute: 0.4 10*3/uL (ref 0.1–1.0)
NEUTROS ABS: 3.3 10*3/uL (ref 1.4–7.7)
Neutrophils Relative %: 58.7 % (ref 43.0–77.0)
Platelets: 180 10*3/uL (ref 150.0–400.0)
RBC: 4.83 Mil/uL (ref 4.22–5.81)
RDW: 13 % (ref 11.5–15.5)
WBC: 5.6 10*3/uL (ref 4.0–10.5)

## 2014-06-14 LAB — LIPID PANEL
Cholesterol: 238 mg/dL — ABNORMAL HIGH (ref 0–200)
HDL: 61.2 mg/dL (ref >39.00)
LDL Cholesterol: 151 mg/dL — ABNORMAL HIGH (ref 0–99)
NonHDL: 176.8
Total CHOL/HDL Ratio: 4
Triglycerides: 131 mg/dL (ref 0.0–149.0)
VLDL: 26.2 mg/dL (ref 0.0–40.0)

## 2014-06-14 LAB — PSA: PSA: 1.61 ng/mL (ref 0.10–4.00)

## 2014-06-14 LAB — TSH: TSH: 2.08 u[IU]/mL (ref 0.35–4.50)

## 2014-07-20 ENCOUNTER — Encounter: Payer: Self-pay | Admitting: Internal Medicine

## 2014-07-20 ENCOUNTER — Ambulatory Visit (INDEPENDENT_AMBULATORY_CARE_PROVIDER_SITE_OTHER): Payer: 59 | Admitting: Internal Medicine

## 2014-07-20 VITALS — BP 104/66 | HR 82 | Temp 97.8°F | Wt 186.0 lb

## 2014-07-20 DIAGNOSIS — K219 Gastro-esophageal reflux disease without esophagitis: Secondary | ICD-10-CM

## 2014-07-20 DIAGNOSIS — R002 Palpitations: Secondary | ICD-10-CM | POA: Insufficient documentation

## 2014-07-20 MED ORDER — PANTOPRAZOLE SODIUM 40 MG PO TBEC: 40.0000 mg | DELAYED_RELEASE_TABLET | Freq: Every day | ORAL | Status: DC

## 2014-07-20 NOTE — Patient Instructions (Signed)
Will schedule a echocardiogram of  your heart  If the palpitations increase, are severe or not gradually improving: Please let us know

## 2014-07-20 NOTE — Assessment & Plan Note (Signed)
Acid reflux well controlled, needs a prescription for Protonix

## 2014-07-20 NOTE — Progress Notes (Signed)
Subjective:    Patient ID: Jonathan MenghiniJan Sanz, male    DOB: 06/16/1956, 59 y.o.   MRN: [id number]  DOS:  07/20/2014 Type of visit - description : To discuss palpitations Interval history: 6 or 7 months history of palpitations described as feeling his heart skipping or fluttering. Symptoms last few seconds, no associated chest pain, diaphoresis, dizziness. Symptoms are noticeable at night, at rest, and sometimes after he eats.  Also, GERD symptoms well controlled, request a refill on PPIs   Review of Systems Denies fever, chills or weight loss No lower extremity edema, orthopnea. No  nausea, vomiting, diarrhea blood in the stools. Denies any unusual anxiety, not taking excessive caffeine  Past Medical History  Diagnosis Date  . GERD (gastroesophageal reflux disease)     EGD 2006 normal  . Mild hyperlipidemia   . Keratitis, herpetic     [facility], on acyclovir for life  . Prostatitis 2014    Past Surgical History  Procedure Laterality Date  . Tonsillectomy      History   Social History  . Marital Status: Single    Spouse Name: N/A    Number of Children: 2  . Years of Education: N/A   Occupational History  . senior Systems analystadministrative coordinator  [employer]   Social History Main Topics  . Smoking status: Never Smoker   . Smokeless tobacco: Never Used  . Alcohol Use: Yes     Comment: socially   . Drug Use: No  . Sexual Activity: Not on file   Other Topics Concern  . Not on file   Social History Narrative   Sexual orientation gay, has a partner on a monogamous relationship            Medication List       This list is accurate as of: 07/20/14  7:16 PM.  Always use your most recent med list.               acyclovir 400 MG tablet  Commonly known as:  ZOVIRAX  Take 400 mg by mouth 2 (two) times daily.     albuterol 108 (90 BASE) MCG/ACT inhaler  Commonly known as:  VENTOLIN HFA  Inhale 2 puffs into the lungs every 6 (six) hours  as needed for wheezing.     CENTRUM SILVER ULTRA MENS Tabs  Take 1 each by mouth daily.     hydrocortisone valerate cream 0.2 %  Commonly known as:  WESTCORT  Apply topically as needed.     OVER THE COUNTER MEDICATION  as needed. [facility] Sin[facility] Congestion Daytime.     pantoprazole 40 MG tablet  Commonly known as:  PROTONIX  Take 1 tablet (40 mg total) by mouth daily.     saw palmetto 160 MG capsule  Take 160 mg by mouth 2 (two) times daily.           Objective:   Physical Exam  Constitutional: He is oriented to person, place, and time. He appears well-developed. No distress.  HENT:  Head: Normocephalic and atraumatic.  Neck: Normal range of motion. Neck supple. No thyromegaly present.  Normal carotid pulses  Cardiovascular:  RRR, no murmur, rub or gallop  Pulmonary/Chest: Effort normal. No stridor. No respiratory distress.  CTA B  Musculoskeletal: Normal range of motion. He exhibits no edema or tenderness.  Lymphadenopathy:    He has no cervical adenopathy.  Neurological: He is alert and oriented to person, place, and time. No  cranial nerve deficit. He exhibits normal muscle tone. Coordination normal.  Speech normal, gait unassisted and normal for age, motor strength appropriate for age   Skin: Skin is warm and dry. No pallor.  Psychiatric: He has a normal mood and affect. His behavior is normal. Judgment and thought content normal.  Vitals reviewed.       Assessment & Plan:   Problem List Items Addressed This Visit    GERD (gastroesophageal reflux disease)    Acid reflux well controlled, needs a prescription for Protonix      Relevant Medications   pantoprazole (PROTONIX) EC tablet   Palpitations - Primary    New problem. Presents with palpitations, no associated symptoms, cardiopulmonary exam negative, recent labs including a TSH normal. EKG today normal sinus and unchanged from previous. Most likely, symptoms are benign, patient is concerned, for  completeness we'll get an echocardiogram. If symptoms persist, change or are not improving he is to notify me. (Symptoms are sometimes postprandial, I wonder about GERD, patient reports that his heartburn is  under excellent control)      Relevant Orders   EKG 12-Lead (Completed)

## 2014-07-20 NOTE — Progress Notes (Signed)
Pre visit review using our clinic review tool, if applicable. No additional management support is needed unless otherwise documented below in the visit note. 

## 2014-07-20 NOTE — Assessment & Plan Note (Addendum)
New problem. Presents with palpitations, no associated symptoms, cardiopulmonary exam negative, recent labs including a TSH normal. EKG today normal sinus and unchanged from previous. Most likely, symptoms are benign, patient is concerned, for completeness we'll get an echocardiogram. If symptoms persist, change or are not improving he is to notify me. (Symptoms are sometimes postprandial, I wonder about GERD, patient reports that his heartburn is  under excellent control)

## 2014-07-26 ENCOUNTER — Other Ambulatory Visit (HOSPITAL_COMMUNITY): Payer: 59

## 2014-08-02 ENCOUNTER — Other Ambulatory Visit (HOSPITAL_COMMUNITY): Payer: 59

## 2014-08-09 ENCOUNTER — Ambulatory Visit (HOSPITAL_COMMUNITY): Payer: 59 | Attending: Internal Medicine | Admitting: Radiology

## 2014-08-09 DIAGNOSIS — R002 Palpitations: Secondary | ICD-10-CM

## 2014-08-09 NOTE — Progress Notes (Signed)
Echocardiogram performed.  

## 2015-01-26 ENCOUNTER — Telehealth: Payer: Self-pay | Admitting: Behavioral Health

## 2015-01-26 ENCOUNTER — Encounter: Payer: Self-pay | Admitting: Behavioral Health

## 2015-01-26 NOTE — Telephone Encounter (Signed)
Pre-Visit Call completed with patient and chart updated.   Pre-Visit Info documented in Specialty Comments under [employer].    

## 2015-01-27 ENCOUNTER — Encounter: Payer: Self-pay | Admitting: Internal Medicine

## 2015-01-27 ENCOUNTER — Ambulatory Visit (INDEPENDENT_AMBULATORY_CARE_PROVIDER_SITE_OTHER): Payer: 59 | Admitting: Internal Medicine

## 2015-01-27 VITALS — BP 102/76 | HR 63 | Temp 98.1°F | Ht 71.0 in | Wt 183.5 lb

## 2015-01-27 DIAGNOSIS — Z0000 Encounter for general adult medical examination without abnormal findings: Secondary | ICD-10-CM | POA: Diagnosis not present

## 2015-01-27 LAB — LIPID PANEL
CHOL/HDL RATIO: 4
Cholesterol: 210 mg/dL — ABNORMAL HIGH (ref 0–200)
HDL: 56 mg/dL (ref >39.00)
LDL Cholesterol: 132 mg/dL — ABNORMAL HIGH (ref 0–99)
NonHDL: 154.26
Triglycerides: 113 mg/dL (ref 0.0–149.0)
VLDL: 22.6 mg/dL (ref 0.0–40.0)

## 2015-01-27 NOTE — Patient Instructions (Signed)
Get your blood work before you leave    

## 2015-01-27 NOTE — Progress Notes (Signed)
Pre visit review using our clinic review tool, if applicable. No additional management support is needed unless otherwise documented below in the visit note. 

## 2015-01-27 NOTE — Assessment & Plan Note (Addendum)
Tetanus shot  2012  zostavax-- on daily zovirax  Colon cancer screening: Patient reports a normal colonoscopy in 2006, not interested on repeat a colonoscopy, we agreed on an IFOB Prostate cancer screening: Last DRE 08-2012, last PSA satisfactory. Labs reviewed, we'll recheck her cholesterol panel and HIV. Continue with healthy lifestyle. Other issues: Bronchospasm: Essentially asymptomatic  GERD: Well controlled with PPIs as needed, usually twice a week

## 2015-01-27 NOTE — Progress Notes (Signed)
Subjective:    Patient ID: Jonathan Morgan, male    DOB: 20-Jul-1955, 59 y.o.   MRN: [id number]  DOS:  01/27/2015 Type of visit - description : CPX Interval history: Feeling great, no concerns    Review of Systems Constitutional: No fever. No chills. No unexplained wt changes. No unusual sweats  HEENT: No dental problems, no ear discharge, no facial swelling, no voice changes. No eye discharge, no eye  redness , no  intolerance to light   Respiratory: No wheezing , no  difficulty breathing. No cough , no mucus production  Cardiovascular: No CP, no leg swelling , no  Palpitations  GI: no nausea, no vomiting, no diarrhea , no  abdominal pain.  No blood in the stools. No dysphagia, no odynophagia    Endocrine: No polyphagia, no polyuria , no polydipsia  GU: No dysuria, gross hematuria, difficulty urinating. No urinary urgency, no frequency.  Musculoskeletal: No joint swellings or unusual aches or pains  Skin: No change in the color of the skin, palor , no  Rash  Allergic, immunologic: No environmental allergies , no  food allergies  Neurological: No dizziness no  syncope. No headaches. No diplopia, no slurred, no slurred speech, no motor deficits, no facial  Numbness  Hematological: No enlarged lymph nodes, no easy bruising , no unusual bleedings  Psychiatry: No suicidal ideas, no hallucinations, no beavior problems, no confusion.  No unusual/severe anxiety, no depression   Past Medical History  Diagnosis Date  . GERD (gastroesophageal reflux disease)     EGD 2006 normal  . Mild hyperlipidemia   . Keratitis, herpetic     ophtalmology at [facility], on acyclovir for life  . Prostatitis 2014    Past Surgical History  Procedure Laterality Date  . Tonsillectomy      Social History   Social History  . Marital Status: Single    Spouse Name: N/A  . Number of Children: 2  . Years of Education: N/A   Occupational History  . senior Dispensing optician   Social History Main Topics  . Smoking status: Never Smoker   . Smokeless tobacco: Never Used  . Alcohol Use: Yes     Comment: socially   . Drug Use: No  . Sexual Activity: Not on file   Other Topics Concern  . Not on file   Social History Narrative   Sexual orientation gay, has a partner on a monogamous relationship         Family History  Problem Relation Age of Onset  . Coronary artery disease Neg Hx   . Diabetes Other     GM  . Hypertension Mother     Judie Petit , younger brother  . Colon cancer Other     aunt  . Prostate cancer Neg Hx   . ALS Mother       Medication List       This list is accurate as of: 01/27/15 10:40 AM.  Always use your most recent med list.               acyclovir 400 MG tablet  Commonly known as:  ZOVIRAX  Take 400 mg by mouth 2 (two) times daily.     albuterol 108 (90 BASE) MCG/ACT inhaler  Commonly known as:  VENTOLIN HFA  Inhale 2 puffs into the lungs every 6 (six) hours as needed for wheezing.     CENTRUM SILVER ULTRA MENS Tabs  Take 1  each by mouth daily.     hydrocortisone valerate cream 0.2 %  Commonly known as:  WESTCORT  Apply topically as needed.     OVER THE COUNTER MEDICATION  as needed. [facility] Congestion Daytime.     pantoprazole 40 MG tablet  Commonly known as:  PROTONIX  Take 1 tablet (40 mg total) by mouth daily.     saw palmetto 160 MG capsule  Take 160 mg by mouth 2 (two) times daily.           Objective:   Physical Exam BP 102/76 mmHg  Pulse 63  Temp(Src) 98.1 F (36.7 C) (Oral)  Ht  (1.803 m)  Wt 183 lb 8 oz (83.235 kg)  BMI 25.60 kg/m2  SpO2 98% General:   Well developed, well nourished . NAD.  HEENT:  Normocephalic . Face symmetric, atraumatic Neck: No thyromegaly Lungs:  CTA B Normal respiratory effort, no intercostal retractions, no accessory muscle use. Heart: RRR,  no murmur.  no pretibial edema bilaterally  Abdomen:  Not distended, soft,  non-tender. No rebound or rigidity. No mass,organomegaly Skin: Not pale. Not jaundice DRE: Declined Neurologic:  alert & oriented X3.  Speech normal, gait appropriate for age and unassisted Psych--  Cognition and judgment appear intact.  Cooperative with normal attention span and concentration.  Behavior appropriate. No anxious or depressed appearing.    Assessment & Plan:

## 2015-01-28 LAB — HIV ANTIBODY (ROUTINE TESTING W REFLEX): HIV 1&2 Ab, 4th Generation: NONREACTIVE

## 2015-01-30 ENCOUNTER — Encounter: Payer: Self-pay | Admitting: Internal Medicine

## 2015-08-02 ENCOUNTER — Ambulatory Visit (INDEPENDENT_AMBULATORY_CARE_PROVIDER_SITE_OTHER): Payer: 59 | Admitting: Internal Medicine

## 2015-08-02 ENCOUNTER — Encounter: Payer: Self-pay | Admitting: Internal Medicine

## 2015-08-02 VITALS — BP 130/80 | HR 78 | Temp 98.3°F | Resp 20 | Wt 187.0 lb

## 2015-08-02 DIAGNOSIS — J069 Acute upper respiratory infection, unspecified: Secondary | ICD-10-CM | POA: Diagnosis not present

## 2015-08-02 MED ORDER — AZITHROMYCIN 250 MG PO TABS: ORAL_TABLET | ORAL | Status: DC

## 2015-08-02 NOTE — Progress Notes (Signed)
Pre visit review using our clinic review tool, if applicable. No additional management support is needed unless otherwise documented below in the visit note. 

## 2015-08-02 NOTE — Progress Notes (Signed)
   Subjective:    Patient ID: Jonathan Morgan, male    DOB: 04-08-1956, 60 y.o.   MRN: [id number]  HPI   Here with 2-3 days acute onset fever, right ear and facial pain, pressure, headache, general weakness and malaise, and greenish d/c, with mild ST and cough, but pt denies chest pain, wheezing, increased sob or doe, orthopnea, PND, increased LE swelling, palpitations, dizziness or syncope. Past Medical History  Diagnosis Date  . GERD (gastroesophageal reflux disease)     EGD 2006 normal  . Mild hyperlipidemia   . Keratitis, herpetic     ophtalmology at [facility], on acyclovir for life  . Prostatitis 2014   Past Surgical History  Procedure Laterality Date  . Tonsillectomy      reports that he has never smoked. He has never used smokeless tobacco. He reports that he drinks alcohol. He reports that he does not use illicit drugs. family history includes ALS in his mother; Colon cancer in his other; Diabetes in his other; Hypertension in his mother. There is no history of Coronary artery disease or Prostate cancer. No Known Allergies Current Outpatient Prescriptions on File Prior to Visit  Medication Sig Dispense Refill  . acyclovir (ZOVIRAX) 400 MG tablet Take 400 mg by mouth 2 (two) times daily.      Marland Kitchen albuterol (VENTOLIN HFA) 108 (90 BASE) MCG/ACT inhaler Inhale 2 puffs into the lungs every 6 (six) hours as needed for wheezing. 1 Inhaler 1  . hydrocortisone (WESTCORT) 0.2 % cream Apply topically as needed. 45 g 3  . Multiple Vitamins-Minerals (CENTRUM SILVER ULTRA MENS) TABS Take 1 each by mouth daily.      Marland Kitchen OVER THE COUNTER MEDICATION as needed. [facility] Sin[facility] Pain [facility] Congestion Daytime.     . pantoprazole (PROTONIX) 40 MG tablet Take 1 tablet (40 mg total) by mouth daily. 90 tablet 3  . saw palmetto 160 MG capsule Take 160 mg by mouth 2 (two) times daily.     No current facility-administered medications on file prior to visit.   Review of Systems All otherwise neg per pt     Objective:     Physical Exam BP 130/80 mmHg  Pulse 78  Temp(Src) 98.3 F (36.8 C) (Oral)  Resp 20  Wt 187 lb (84.823 kg)  SpO2 97% VS noted, mild ill appaering Constitutional: Pt appears in no significant distress HENT: Head: NCAT.  Right Ear: External ear normal.  Left Ear: External ear normal.  Eyes: . Pupils are equal, round, and reactive to light. Conjunctivae and EOM are normal Bilat tm's with mild erythema.  Max sinus areas mild tender.  Pharynx with mild erythema, no exudate Neck: Normal range of motion. Neck supple.  Cardiovascular: Normal rate and regular rhythm.   Pulmonary/Chest: Effort normal and breath sounds without rales or wheezing.  Neurological: Pt is alert. Not confused , motor grossly intact Skin: Skin is warm. No rash, no LE edema Psychiatric: Pt behavior is normal. No agitation.      Assessment & Plan:

## 2015-08-02 NOTE — Patient Instructions (Signed)
Please take all new medication as prescribed - the antibiotic  You can also take Delsym OTC for cough, and/or Mucinex (or it's generic off brand) for congestion, and tylenol as needed for pain.  Please continue all other medications as before, and refills have been done if requested.  Please have the pharmacy call with any other refills you may need.  Please keep your appointments with your specialists as you may have planned    

## 2015-08-06 NOTE — Assessment & Plan Note (Signed)
Mild to mod, for antibx course,  to f/u any worsening symptoms or concerns 

## 2016-02-02 ENCOUNTER — Ambulatory Visit (INDEPENDENT_AMBULATORY_CARE_PROVIDER_SITE_OTHER): Payer: 59 | Admitting: Internal Medicine

## 2016-02-02 ENCOUNTER — Encounter: Payer: Self-pay | Admitting: Internal Medicine

## 2016-02-02 VITALS — BP 110/70 | HR 56 | Temp 98.2°F | Resp 12 | Ht 71.0 in | Wt 187.5 lb

## 2016-02-02 DIAGNOSIS — Z09 Encounter for follow-up examination after completed treatment for conditions other than malignant neoplasm: Secondary | ICD-10-CM | POA: Insufficient documentation

## 2016-02-02 DIAGNOSIS — Z125 Encounter for screening for malignant neoplasm of prostate: Secondary | ICD-10-CM | POA: Diagnosis not present

## 2016-02-02 DIAGNOSIS — Z0000 Encounter for general adult medical examination without abnormal findings: Secondary | ICD-10-CM

## 2016-02-02 LAB — COMPREHENSIVE METABOLIC PANEL
ALK PHOS: 39 U/L (ref 39–117)
ALT: 17 U/L (ref 0–53)
AST: 18 U/L (ref 0–37)
Albumin: 4.4 g/dL (ref 3.5–5.2)
BILIRUBIN TOTAL: 0.6 mg/dL (ref 0.2–1.2)
BUN: 18 mg/dL (ref 6–23)
CALCIUM: 9.3 mg/dL (ref 8.4–10.5)
CO2: 31 mEq/L (ref 19–32)
Chloride: 106 mEq/L (ref 96–112)
Creatinine, Ser: 0.86 mg/dL (ref 0.40–1.50)
GFR: 95.71 mL/min (ref >60.00)
Glucose, Bld: 96 mg/dL (ref 70–99)
Potassium: 4 mEq/L (ref 3.5–5.1)
Sodium: 141 mEq/L (ref 135–145)
TOTAL PROTEIN: 6.5 g/dL (ref 6.0–8.3)

## 2016-02-02 LAB — CBC WITH DIFFERENTIAL/PLATELET
Basophils Absolute: 0 K/uL (ref 0.0–0.1)
Basophils Relative: 0.2 % (ref 0.0–3.0)
Eosinophils Absolute: 0.1 K/uL (ref 0.0–0.7)
Eosinophils Relative: 2.2 % (ref 0.0–5.0)
HCT: 43 % (ref 39.0–52.0)
Hemoglobin: 14.7 g/dL (ref 13.0–17.0)
Lymphocytes Relative: 32.2 % (ref 12.0–46.0)
Lymphs Abs: 1.2 K/uL (ref 0.7–4.0)
MCHC: 34.1 g/dL (ref 30.0–36.0)
MCV: 90.6 fl (ref 78.0–100.0)
Monocytes Absolute: 0.3 K/uL (ref 0.1–1.0)
Monocytes Relative: 7.4 % (ref 3.0–12.0)
Neutro Abs: 2.2 K/uL (ref 1.4–7.7)
Neutrophils Relative %: 58 % (ref 43.0–77.0)
Platelets: 175 K/uL (ref 150.0–400.0)
RBC: 4.75 Mil/uL (ref 4.22–5.81)
RDW: 13.2 % (ref 11.5–15.5)
WBC: 3.8 K/uL — ABNORMAL LOW (ref 4.0–10.5)

## 2016-02-02 LAB — LIPID PANEL
CHOL/HDL RATIO: 4
CHOLESTEROL: 203 mg/dL — AB (ref 0–200)
HDL: 53 mg/dL (ref >39.00)
LDL CALC: 120 mg/dL — AB (ref 0–99)
NonHDL: 149.69
TRIGLYCERIDES: 146 mg/dL (ref 0.0–149.0)
VLDL: 29.2 mg/dL (ref 0.0–40.0)

## 2016-02-02 LAB — PSA: PSA: 2.23 ng/mL (ref 0.10–4.00)

## 2016-02-02 NOTE — Assessment & Plan Note (Signed)
Here for a CPX, doing well, chronic medical problem seems controlled, on PPIs twice a week, GERD controlled. RTC one year

## 2016-02-02 NOTE — Progress Notes (Signed)
Subjective:    Patient ID: Jonathan CrouchJan L Morgan, male    DOB: 02/11/1956, 60 y.o.   MRN: [id number]  DOS:  02/02/2016 Type of visit - description : CPX Interval history: No major concerns, he is very active, exercising almost daily, trying to eat healthy    Review of Systems Constitutional: No fever. No chills. No unexplained wt changes. No unusual sweats  HEENT: No dental problems, no ear discharge, no facial swelling, no voice changes. No eye discharge, no eye  redness , no  intolerance to light   Respiratory: No wheezing , no  difficulty breathing. No cough , no mucus production  Cardiovascular: No CP, no leg swelling , no  Palpitations  GI: no nausea, no vomiting, no diarrhea , no  abdominal pain.  No blood in the stools. No dysphagia, no odynophagia    Endocrine: No polyphagia, no polyuria , no polydipsia  GU: No dysuria, gross hematuria, difficulty urinating. No urinary urgency, no frequency.  Musculoskeletal: Occasional low back pain, without radiation. Most recent exacerbation last week, slightly better today  Skin: No change in the color of the skin, palor , no  Rash  Allergic, immunologic: No environmental allergies , no  food allergies  Neurological: No dizziness no  syncope. No headaches. No diplopia, no slurred, no slurred speech, no motor deficits, no facial  Numbness  Hematological: No enlarged lymph nodes, no easy bruising , no unusual bleedings  Psychiatry: No suicidal ideas, no hallucinations, no beavior problems, no confusion.  No unusual/severe anxiety, no depression but some stress lately, his son was sick in the hospital but he is getting better.  Past Medical History:  Diagnosis Date  . GERD (gastroesophageal reflux disease)    EGD 2006 normal  . 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 Mild hyperlipidemia   . Prostatitis 2014    Past Surgical History:  Procedure Laterality Date  . TONSILLECTOMY      Social  History   Social History  . Marital status: Married    Spouse name: N/A  . Number of children: 2  . Years of education: N/A   Occupational History  . senior Systems analystadministrative coordinator  [employer]   Social History Main Topics  . Smoking status: Never Smoker  . Smokeless tobacco: Never Used  . Alcohol use Yes     Comment: socially   . Drug use: No  . Sexual activity: Not on file   Other Topics Concern  . Not on file   Social History Narrative   Sexual orientation gay, has a husband on a monogamous relationship         Family History  Problem Relation Age of Onset  . Diabetes Other     GM  . Hypertension Mother     Judie PetitM , younger brother  . ALS Mother   . Colon cancer Other     aunt  . Coronary artery disease Neg Hx   . Prostate cancer Neg Hx        Medication List       Accurate as of 02/02/16  5:07 PM. Always use your most recent med list.          acyclovir 400 MG tablet Commonly known as:  ZOVIRAX Take 400 mg by mouth 2 (two) times daily.   albuterol 108 (90 Base) MCG/ACT inhaler Commonly known as:  VENTOLIN HFA Inhale 2 puffs into the lungs every 6 (six) hours as  needed for wheezing.   CENTRUM SILVER ULTRA MENS Tabs Take 1 each by mouth daily.   pantoprazole 40 MG tablet Commonly known as:  PROTONIX Take 1 tablet (40 mg total) by mouth daily.   saw palmetto 160 MG capsule Take 160 mg by mouth 2 (two) times daily.          Objective:   Physical Exam BP 110/70 (BP Location: Left Arm, Patient Position: Sitting, Cuff Size: Normal)   Pulse (!) 56   Temp 98.2 F (36.8 C) (Oral)   Resp 12   Ht 5\' 11"  (1.803 m)   Wt 187 lb 8 oz (85 kg)   SpO2 98%   BMI 26.15 kg/m   General:   Well developed, well nourished . NAD.  Neck: No  thyromegaly  HEENT:  Normocephalic . Face symmetric, atraumatic Lungs:  CTA B Normal respiratory effort, no intercostal retractions, no accessory muscle use. Heart: RRR,  no murmur.  No  pretibial edema bilaterally  Abdomen:  Not distended, soft, non-tender. No rebound or rigidity.   Rectal:  External abnormalities: none. Normal sphincter tone. No rectal masses or tenderness.  Stool brown  Prostate: Prostate gland firm and smooth, no enlargement, nodularity, tenderness, mass, asymmetry or induration.  Skin: Exposed areas without rash. Not pale. Not jaundice Neurologic:  alert & oriented X3.  Speech normal, gait appropriate for age and unassisted Strength symmetric and appropriate for age.  Psych: Cognition and judgment appear intact.  Cooperative with normal attention span and concentration.  Behavior appropriate. No anxious or depressed appearing.    Assessment & Plan:   Assessment: Mild hyperlipidemia GERD, normal EGD 2006 Herpetic keratitis: [facility], on acyclovir  for life H/o bronchospasm H/o Prostatitis 2014  PLAN: Here for a CPX, doing well, chronic medical problem seems controlled, on PPIs twice a week, GERD controlled. RTC one year

## 2016-02-02 NOTE — Assessment & Plan Note (Addendum)
Tetanus shot  2012  zostavax-- on daily zovirax  Colon cancer screening:   normal colonoscopy in 2006 per pt, not interested on repeat a colonoscopy,  IFOB not done last year but plans to send it back Prostate cancer screening:  DRE today wnl, check a  PSA  Labs : FLP, CMP, CBC, PSA Continue with his healthy lifestyle

## 2016-02-02 NOTE — Progress Notes (Signed)
Pre visit review using our clinic review tool, if applicable. No additional management support is needed unless otherwise documented below in the visit note. 

## 2016-02-02 NOTE — Patient Instructions (Signed)
GO TO THE LAB : Get the blood work     GO TO THE FRONT DESK Schedule your next appointment for a  complete physical exam in one year  Please send back the stool test

## 2016-02-20 ENCOUNTER — Telehealth: Payer: Self-pay

## 2016-02-20 DIAGNOSIS — Z0000 Encounter for general adult medical examination without abnormal findings: Secondary | ICD-10-CM

## 2016-02-20 NOTE — Telephone Encounter (Signed)
Please  provide the patient with a current iFOB kit 

## 2016-02-20 NOTE — Telephone Encounter (Signed)
MyChart message sent to Pt informing new IFOB-ICT kit being mailed to him.

## 2016-02-20 NOTE — Telephone Encounter (Signed)
Received call from Santiago Glad at [facility], they received IFOB kit which they no longer use. Now using IFOB-ICT kits. Will inform PCP.

## 2016-04-11 ENCOUNTER — Other Ambulatory Visit (INDEPENDENT_AMBULATORY_CARE_PROVIDER_SITE_OTHER): Payer: 59

## 2016-04-11 DIAGNOSIS — Z0000 Encounter for general adult medical examination without abnormal findings: Secondary | ICD-10-CM | POA: Diagnosis not present

## 2016-04-11 LAB — FECAL OCCULT BLOOD, IMMUNOCHEMICAL: FECAL OCCULT BLD: NEGATIVE

## 2016-06-20 ENCOUNTER — Other Ambulatory Visit: Payer: Self-pay

## 2016-06-20 MED ORDER — PANTOPRAZOLE SODIUM 40 MG PO TBEC: 40.0000 mg | DELAYED_RELEASE_TABLET | ORAL | 3 refills | Status: AC

## 2016-06-24 ENCOUNTER — Telehealth: Payer: Self-pay | Admitting: Internal Medicine

## 2016-06-24 NOTE — Telephone Encounter (Signed)
Noted  

## 2016-06-24 NOTE — Telephone Encounter (Signed)
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 Drue NovelPaz refills his meds. [facility] fax # [phone]  Phone # [phone]

## 2017-02-07 ENCOUNTER — Encounter: Payer: 59 | Admitting: Internal Medicine
